# Patient Record
Sex: Female | Born: 1995 | Race: Black or African American | Hispanic: No | Marital: Single | Smoking: Never smoker
Health system: Southern US, Community
[De-identification: ages and names within clinical notes are randomized; demographics above are authoritative.]

## PROBLEM LIST (undated history)

## (undated) ENCOUNTER — Emergency Department (HOSPITAL_COMMUNITY): Admission: EM

## (undated) DIAGNOSIS — G809 Cerebral palsy, unspecified: Secondary | ICD-10-CM

## (undated) DIAGNOSIS — R6250 Unspecified lack of expected normal physiological development in childhood: Secondary | ICD-10-CM

## (undated) HISTORY — DX: Unspecified lack of expected normal physiological development in childhood: R62.50

## (undated) HISTORY — PX: LAPAROSCOPIC REVISION VENTRICULAR-PERITONEAL (V-P) SHUNT: SHX5924

---
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
## 2014-06-24 ENCOUNTER — Encounter (HOSPITAL_COMMUNITY): Payer: Self-pay | Admitting: Emergency Medicine

## 2014-06-24 ENCOUNTER — Emergency Department (HOSPITAL_COMMUNITY)
Admission: EM | Admit: 2014-06-24 | Discharge: 2014-06-24 | Disposition: A | Discharge status: Home / Self Care | Payer: Medicaid Other | Attending: Emergency Medicine | Admitting: Emergency Medicine

## 2014-06-24 DIAGNOSIS — R21 Rash and other nonspecific skin eruption: Secondary | ICD-10-CM | POA: Diagnosis present

## 2014-06-24 DIAGNOSIS — Z3202 Encounter for pregnancy test, result negative: Secondary | ICD-10-CM | POA: Diagnosis not present

## 2014-06-24 DIAGNOSIS — IMO0002 Reserved for concepts with insufficient information to code with codable children: Secondary | ICD-10-CM | POA: Insufficient documentation

## 2014-06-24 DIAGNOSIS — N390 Urinary tract infection, site not specified: Secondary | ICD-10-CM | POA: Insufficient documentation

## 2014-06-24 DIAGNOSIS — N72 Inflammatory disease of cervix uteri: Secondary | ICD-10-CM | POA: Diagnosis not present

## 2014-06-24 LAB — URINALYSIS, ROUTINE W REFLEX MICROSCOPIC
Bilirubin Urine: NEGATIVE
Glucose, UA: NEGATIVE mg/dL
Ketones, ur: NEGATIVE mg/dL
Nitrite: NEGATIVE
Protein, ur: 30 mg/dL — AB
Specific Gravity, Urine: 1.025 (ref 1.005–1.030)
Urobilinogen, UA: 1 mg/dL (ref 0.0–1.0)
pH: 7.5 (ref 5.0–8.0)

## 2014-06-24 LAB — URINE MICROSCOPIC-ADD ON

## 2014-06-24 LAB — WET PREP, GENITAL
CLUE CELLS WET PREP: NONE SEEN
Trich, Wet Prep: NONE SEEN
YEAST WET PREP: NONE SEEN

## 2014-06-24 LAB — RPR

## 2014-06-24 LAB — POC URINE PREG, ED: Preg Test, Ur: NEGATIVE

## 2014-06-24 LAB — HIV ANTIBODY (ROUTINE TESTING W REFLEX): HIV 1&2 Ab, 4th Generation: NONREACTIVE

## 2014-06-24 MED ORDER — CEPHALEXIN 500 MG PO CAPS: 500.0000 mg | ORAL_CAPSULE | Freq: Four times a day (QID) | ORAL | Status: DC

## 2014-06-24 MED ORDER — CEFTRIAXONE SODIUM 1 G IJ SOLR
1.0000 g | Freq: Once | INTRAMUSCULAR | Status: AC
  Administered 2014-06-24: 1 g via INTRAMUSCULAR
  Filled 2014-06-24: qty 10

## 2014-06-24 MED ORDER — MUPIROCIN CALCIUM 2 % EX CREA: 1.0000 "application " | TOPICAL_CREAM | Freq: Two times a day (BID) | CUTANEOUS | Status: DC

## 2014-06-24 MED ORDER — HYDROCODONE-ACETAMINOPHEN 5-325 MG PO TABS: ORAL_TABLET | ORAL | Status: DC

## 2014-06-24 MED ORDER — ACETAMINOPHEN 325 MG PO TABS
650.0000 mg | ORAL_TABLET | Freq: Once | ORAL | Status: AC
  Administered 2014-06-24: 650 mg via ORAL
  Filled 2014-06-24: qty 2

## 2014-06-24 MED ORDER — LIDOCAINE HCL (PF) 1 % IJ SOLN
2.0000 mL | Freq: Once | INTRAMUSCULAR | Status: AC
  Administered 2014-06-24: 2 mL via INTRADERMAL
  Filled 2014-06-24: qty 2

## 2014-06-24 MED ORDER — AZITHROMYCIN 250 MG PO TABS
1000.0000 mg | ORAL_TABLET | Freq: Once | ORAL | Status: AC
  Administered 2014-06-24: 1000 mg via ORAL
  Filled 2014-06-24: qty 4

## 2014-06-24 NOTE — ED Notes (Signed)
Pt states that her left leg has been hurting for several weeks and now has red rash on inner thigh, also c/o burning upon urination.

## 2014-06-24 NOTE — ED Provider Notes (Signed)
CSN: [id number]     Arrival date & time 06/24/14  4098 History   First MD Initiated Contact with Patient 06/24/14 [id number]     Chief Complaint  Patient presents with  . Rash  . Urinary Tract Infection     (Consider location/radiation/quality/duration/timing/severity/associated sxs/prior Treatment) HPI  Carol Wheeler is a 18 y.o. female complaining of rash and perennial area, dysuria and abnormal vaginal discharge (yellowish) within the last 24 hours. Patient states that she had recent unprotected sex 4 days ago. She denies fever, abdominal pain, nausea vomiting, change in bowel habits. Has history of Chlamydia in the past, date she received treatment. Patient states she's been applying diaper cream and talcum power to the rash with little relief. States that it is uncomfortable and painful. She denies itching.  Past Medical History  Diagnosis Date  . Developmental delay    Past Surgical History  Procedure Laterality Date  . Laparoscopic revision ventricular-peritoneal (v-p) shunt     No family history on file. History  Substance Use Topics  . Smoking status: Never Smoker   . Smokeless tobacco: Not on file  . Alcohol Use: No   OB History   Grav Para Term Preterm Abortions TAB SAB Ect Mult Living                 Review of Systems  10 systems reviewed and found to be negative, except as noted in the HPI.   Allergies  Review of patient's allergies indicates no known allergies.  Home Medications   Prior to Admission medications   Medication Sig Start Date End Date Taking? Authorizing Provider  Fluocinolone Acetonide (DERMA-SMOOTHE/FS SCALP) 0.01 % OIL Apply 1 application topically daily.   Yes Historical Provider, MD  cephALEXin (KEFLEX) 500 MG capsule Take 1 capsule (500 mg total) by mouth 4 (four) times daily. 06/24/14   Wright Gravely, PA-C  HYDROcodone-acetaminophen (NORCO/VICODIN) 5-325 MG per tablet Take 1-2 tablets by mouth every 6 hours as needed for pain. 06/24/14    Deklen Popelka, PA-C  mupirocin cream (BACTROBAN) 2 % Apply 1 application topically 2 (two) times daily. 06/24/14   Sahory Nordling, PA-C   BP 124/63  Pulse 83  Temp(Src) 98.5 F (36.9 C) (Oral)  Resp 15  Ht  (1.6 m)  Wt 144 lb (65.318 kg)  BMI 25.51 kg/m2  SpO2 99%  LMP 03/24/2014 Physical Exam  Nursing note and vitals reviewed. Constitutional: She is oriented to person, place, and time. She appears well-developed and well-nourished. No distress.  HENT:  Head: Normocephalic and atraumatic.  Mouth/Throat: Oropharynx is clear and moist.  Eyes: Conjunctivae and EOM are normal. Pupils are equal, round, and reactive to light.  Cardiovascular: Normal rate, regular rhythm and intact distal pulses.   Pulmonary/Chest: Effort normal and breath sounds normal. No stridor. No respiratory distress. She has no wheezes. She has no rales. She exhibits no tenderness.  Abdominal: Soft. Bowel sounds are normal. She exhibits no distension and no mass. There is no tenderness. There is no rebound and no guarding.  Genitourinary:     Pelvic exam is chaperoned by technician:  No discrete blistering lesions  Large patches of induration, erythematous and tender to palpation as diagrammed. No focal fluctuance.  Foul-smelling yellow opaque vaginal discharge. No cervical motion or adnexal tenderness.  Musculoskeletal: Normal range of motion. She exhibits no edema.  Neurological: She is alert and oriented to person, place, and time.  Delayed cognition  Skin: Skin is warm.  Psychiatric: She has a  normal mood and affect.    ED Course  Procedures (including critical care time) Labs Review Labs Reviewed  WET PREP, GENITAL - Abnormal; Notable for the following:    WBC, Wet Prep HPF POC TOO NUMEROUS TO COUNT (*)    All other components within normal limits  URINALYSIS, ROUTINE W REFLEX MICROSCOPIC - Abnormal; Notable for the following:    APPearance TURBID (*)    Hgb urine dipstick SMALL (*)      Protein, ur 30 (*)    Leukocytes, UA LARGE (*)    All other components within normal limits  URINE MICROSCOPIC-ADD ON - Abnormal; Notable for the following:    Squamous Epithelial / LPF FEW (*)    Bacteria, UA MANY (*)    Crystals TRIPLE PHOSPHATE CRYSTALS (*)    All other components within normal limits  GC/CHLAMYDIA PROBE AMP  RPR  HIV ANTIBODY (ROUTINE TESTING)  POC URINE PREG, ED    Imaging Review No results found.   EKG Interpretation None      MDM   Final diagnoses:  Cervicitis  Rash    Filed Vitals:   06/24/14 0845 06/24/14 1002  BP: 126/77 124/63  Pulse: 92 83  Temp: 98.5 F (36.9 C)   TempSrc: Oral   Resp: 18 15  Height:  (1.6 m)   Weight: 144 lb (65.318 kg)   SpO2: 100% 99%    Medications  cefTRIAXone (ROCEPHIN) injection 1 g (not administered)  azithromycin (ZITHROMAX) tablet 1,000 mg (not administered)  acetaminophen (TYLENOL) tablet 650 mg (not administered)  lidocaine (PF) (XYLOCAINE) 1 % injection 2 mL (not administered)    Carol Wheeler is a 18 y.o. female presenting with foul-smelling vaginal discharge, perennial rash or recent unprotected sex. Serial abdominal exams are benign. Patient has no systemic signs of infection. Pelvic exam is not concerning for pelvic inflammatory disease: No cervical motion or adnexal tenderness.. Patient's rash is atypical and appears to be a cellulitis rather than a herpes or other rash with discrete lesions. Wet prep shows too numerous to count white blood cells. She will be given Rocephin and azithromycin. Patient's UA shows many bacteria and too numerous to count white blood cells with large leukocytes but she is nitrite negative. It is unclear if this is a urinary tract infection or contamination from the cervicitis. I think that her pain at urination may be from irritation to the cellulitis on her thighs. Patient will also be started on Keflex for cellulitis and UTI. I have encouraged her to refrain from  sex for at least several weeks. HIV and RPR are pending. We have discussed return precautions.   Evaluation does not show pathology that would require ongoing emergent intervention or inpatient treatment. Pt is hemodynamically stable and mentating appropriately. Discussed findings and plan with patient/guardian, who agrees with care plan. All questions answered. Return precautions discussed and outpatient follow up given.   New Prescriptions   CEPHALEXIN (KEFLEX) 500 MG CAPSULE    Take 1 capsule (500 mg total) by mouth 4 (four) times daily.   HYDROCODONE-ACETAMINOPHEN (NORCO/VICODIN) 5-325 MG PER TABLET    Take 1-2 tablets by mouth every 6 hours as needed for pain.   MUPIROCIN CREAM (BACTROBAN) 2 %    Apply 1 application topically 2 (two) times daily.         Wynetta Emery, PA-C 06/24/14 1025

## 2014-06-24 NOTE — Discharge Instructions (Signed)
You were not tested for all STDs today. Your gonorrhea and chlamydia tests are pending- if they are positive, you will receive a phone call. Refrain from sex until you have the results from a full STD screen.   Take vicodin for breakthrough pain, do not drink alcohol, drive, care for children or do other critical tasks while taking vicodin.  Take your antibiotics as directed and to completion. You should never have any leftover antibiotics! Push fluids and stay well hydrated.   Please follow with your primary care doctor in the next 2 days for a check-up. They must obtain records for further management.   Do not hesitate to return to the Emergency Department for any new, worsening or concerning symptoms.    Cervicitis Cervicitis is a soreness and swelling (inflammation) of the cervix. Your cervix is located at the bottom of your uterus. It opens up to the vagina. CAUSES   Sexually transmitted infections (STIs).   Allergic reaction.   Medicines or birth control devices that are put in the vagina.   Injury to the cervix.   Bacterial infections.  RISK FACTORS You are at greater risk if you:  Have unprotected sexual intercourse.  Have sexual intercourse with many partners.  Began sexual intercourse at an early age.  Have a history of STIs. SYMPTOMS  There may be no symptoms. If symptoms occur, they may include:   Gray, white, yellow, or bad-smelling vaginal discharge.   Pain or itching of the area outside the vagina.   Painful sexual intercourse.   Lower abdominal or lower back pain, especially during intercourse.   Frequent urination.   Abnormal vaginal bleeding between periods, after sexual intercourse, or after menopause.   Pressure or a heavy feeling in the pelvis.  DIAGNOSIS  Diagnosis is made after a pelvic exam. Other tests may include:   Examination of any discharge under a microscope (wet prep).   A Pap test.  TREATMENT  Treatment will  depend on the cause of cervicitis. If it is caused by an STI, both you and your partner will need to be treated. Antibiotic medicines will be given.  HOME CARE INSTRUCTIONS   Do not have sexual intercourse until your health care provider says it is okay.   Do not have sexual intercourse until your partner has been treated, if your cervicitis is caused by an STI.   Take your antibiotics as directed. Finish them even if you start to feel better.  SEEK MEDICAL CARE IF:  Your symptoms come back.   You have a fever.  MAKE SURE YOU:   Understand these instructions.  Will watch your condition.  Will get help right away if you are not doing well or get worse. Document Released: 10/01/2005 Document Revised: 10/06/2013 Document Reviewed: 03/25/2013 [facility] Patient Information 2015 [place], [place]. This information is not intended to replace advice given to you by your health care provider. Make sure you discuss any questions you have with your health care provider.

## 2014-06-25 LAB — GC/CHLAMYDIA PROBE AMP
CT Probe RNA: NEGATIVE
GC Probe RNA: NEGATIVE

## 2014-06-28 NOTE — ED Provider Notes (Signed)
Medical screening examination/treatment/procedure(s) were performed by non-physician practitioner and as supervising physician I was immediately available for consultation/collaboration.   EKG Interpretation None        Rayon Mcchristian David Leisa Gault III, MD 06/28/14 1633 

## 2014-10-17 ENCOUNTER — Encounter (HOSPITAL_COMMUNITY): Payer: Self-pay | Admitting: *Deleted

## 2014-10-17 ENCOUNTER — Other Ambulatory Visit (HOSPITAL_COMMUNITY)
Admission: RE | Admit: 2014-10-17 | Discharge: 2014-10-17 | Disposition: A | Discharge status: Home / Self Care | Payer: Medicaid Other | Source: Ambulatory Visit | Attending: Emergency Medicine | Admitting: Emergency Medicine

## 2014-10-17 ENCOUNTER — Emergency Department (INDEPENDENT_AMBULATORY_CARE_PROVIDER_SITE_OTHER)
Admission: EM | Admit: 2014-10-17 | Discharge: 2014-10-17 | Disposition: A | Discharge status: Home / Self Care | Payer: 59 | Source: Home / Self Care

## 2014-10-17 DIAGNOSIS — N760 Acute vaginitis: Secondary | ICD-10-CM | POA: Diagnosis present

## 2014-10-17 DIAGNOSIS — N730 Acute parametritis and pelvic cellulitis: Secondary | ICD-10-CM

## 2014-10-17 DIAGNOSIS — Z113 Encounter for screening for infections with a predominantly sexual mode of transmission: Secondary | ICD-10-CM | POA: Insufficient documentation

## 2014-10-17 LAB — CBC WITH DIFFERENTIAL/PLATELET
Basophils Absolute: 0 10*3/uL (ref 0.0–0.1)
Basophils Relative: 0 % (ref 0–1)
EOS ABS: 0 10*3/uL (ref 0.0–0.7)
EOS PCT: 0 % (ref 0–5)
HEMATOCRIT: 43.3 % (ref 36.0–46.0)
HEMOGLOBIN: 14.3 g/dL (ref 12.0–15.0)
Lymphocytes Relative: 34 % (ref 12–46)
Lymphs Abs: 2.4 10*3/uL (ref 0.7–4.0)
MCH: 27.2 pg (ref 26.0–34.0)
MCHC: 33 g/dL (ref 30.0–36.0)
MCV: 82.5 fL (ref 78.0–100.0)
MONO ABS: 0.6 10*3/uL (ref 0.1–1.0)
MONOS PCT: 8 % (ref 3–12)
Neutro Abs: 3.9 10*3/uL (ref 1.7–7.7)
Neutrophils Relative %: 58 % (ref 43–77)
Platelets: 242 10*3/uL (ref 150–400)
RBC: 5.25 MIL/uL — AB (ref 3.87–5.11)
RDW: 13.5 % (ref 11.5–15.5)
WBC: 6.9 10*3/uL (ref 4.0–10.5)

## 2014-10-17 LAB — POCT I-STAT, CHEM 8
BUN: 5 mg/dL — ABNORMAL LOW (ref 6–23)
CREATININE: 0.8 mg/dL (ref 0.50–1.10)
Calcium, Ion: 1.19 mmol/L (ref 1.12–1.23)
Chloride: 103 mEq/L (ref 96–112)
GLUCOSE: 84 mg/dL (ref 70–99)
HCT: 50 % — ABNORMAL HIGH (ref 36.0–46.0)
HEMOGLOBIN: 17 g/dL — AB (ref 12.0–15.0)
Potassium: 3.9 mmol/L (ref 3.5–5.1)
Sodium: 140 mmol/L (ref 135–145)
TCO2: 22 mmol/L (ref 0–100)

## 2014-10-17 LAB — HIV ANTIBODY (ROUTINE TESTING W REFLEX): HIV: NONREACTIVE

## 2014-10-17 LAB — POCT URINALYSIS DIP (DEVICE)
BILIRUBIN URINE: NEGATIVE
Glucose, UA: NEGATIVE mg/dL
HGB URINE DIPSTICK: NEGATIVE
KETONES UR: NEGATIVE mg/dL
NITRITE: NEGATIVE
PH: 7.5 (ref 5.0–8.0)
Protein, ur: NEGATIVE mg/dL
SPECIFIC GRAVITY, URINE: 1.015 (ref 1.005–1.030)
Urobilinogen, UA: 1 mg/dL (ref 0.0–1.0)

## 2014-10-17 LAB — RPR

## 2014-10-17 LAB — POCT PREGNANCY, URINE: Preg Test, Ur: NEGATIVE

## 2014-10-17 MED ORDER — ONDANSETRON 4 MG PO TBDP
8.0000 mg | ORAL_TABLET | Freq: Once | ORAL | Status: AC
  Administered 2014-10-17: 8 mg via ORAL

## 2014-10-17 MED ORDER — CEFTRIAXONE SODIUM 250 MG IJ SOLR
250.0000 mg | Freq: Once | INTRAMUSCULAR | Status: AC
  Administered 2014-10-17: 250 mg via INTRAMUSCULAR

## 2014-10-17 MED ORDER — CEFTRIAXONE SODIUM 250 MG IJ SOLR
INTRAMUSCULAR | Status: AC
  Filled 2014-10-17: qty 250

## 2014-10-17 MED ORDER — HYDROCODONE-ACETAMINOPHEN 5-325 MG PO TABS: ORAL_TABLET | ORAL | Status: DC

## 2014-10-17 MED ORDER — DOXYCYCLINE HYCLATE 100 MG PO TABS: 100.0000 mg | ORAL_TABLET | Freq: Two times a day (BID) | ORAL | Status: DC

## 2014-10-17 MED ORDER — ONDANSETRON 8 MG PO TBDP: 8.0000 mg | ORAL_TABLET | Freq: Three times a day (TID) | ORAL | Status: DC | PRN

## 2014-10-17 MED ORDER — AZITHROMYCIN 250 MG PO TABS
1000.0000 mg | ORAL_TABLET | Freq: Once | ORAL | Status: AC
  Administered 2014-10-17: 1000 mg via ORAL

## 2014-10-17 MED ORDER — LIDOCAINE HCL (PF) 1 % IJ SOLN
INTRAMUSCULAR | Status: AC
  Filled 2014-10-17: qty 5

## 2014-10-17 MED ORDER — AZITHROMYCIN 250 MG PO TABS
ORAL_TABLET | ORAL | Status: AC
  Filled 2014-10-17: qty 4

## 2014-10-17 MED ORDER — ONDANSETRON 4 MG PO TBDP
ORAL_TABLET | ORAL | Status: AC
  Filled 2014-10-17: qty 2

## 2014-10-17 MED ORDER — METRONIDAZOLE 500 MG PO TABS: 500.0000 mg | ORAL_TABLET | Freq: Two times a day (BID) | ORAL | Status: DC

## 2014-10-17 NOTE — ED Notes (Signed)
Pt  Reports  Nausea   With  intermittant  abd  Pain       whitch  Developed  Last      Pm      She  denys   Any   Vomiting     Pt  Ambulated  To  Room with a  Steady  Fluid     Gait    Appearing  In no  Distress

## 2014-10-17 NOTE — ED Provider Notes (Signed)
Chief Complaint   Nausea   History of Present Illness   Carol Wheeler is a 19 year old female who's had a two-day history of abdominal pain. This began in the epigastric area and radiated to the bilateral lower abdomen. It's been as high as a 10 over 10 in intensity now is an 8/10. It's been associated with nausea, lower back pain, chills, and a few loose stools. She denies any fever, vomiting, dysuria, frequency, blood in the urine, vaginal discharge, itching, abnormal bleeding. Her last menstrual period was 2 weeks ago. She is sexually active with use of the NuvaRing but no other protection. The patient has an abortion about a year ago.  Review of Systems   Other than as noted above, the patient denies any of the following symptoms: Constitutional:  No fever, chills, weight loss or anorexia. Abdomen:  No nausea, vomiting, hematememesis, melena, diarrhea, or hematochezia. GU:  No dysuria, frequency, urgency, or hematuria. Gyn:  No vaginal discharge, itching, abnormal bleeding, dyspareunia, or pelvic pain.  PMFSH   Past medical history, family history, social history, meds, and allergies were reviewed. She has a ventriculoperitoneal shunt.  Physical Exam     Vital signs:  BP 100/72 mmHg  Pulse 99  Temp(Src) 98.2 F (36.8 C) (Oral)  Resp 16  SpO2 98%  LMP 10/04/2014 Gen:  Alert, oriented, in no distress. Lungs:  Breath sounds clear and equal bilaterally.  No wheezes, rales or rhonchi. Heart:  Regular rhythm.  No gallops or murmers.   Abdomen:  Soft, flat, nondistended. No organomegaly or mass. Bowel sounds are normally active. The patient has mild, diffuse tenderness to palpation without guarding or rebound. No localizing tenderness to palpation. Pelvic:  Normal external genitalia, vaginal and cervical mucosa were normal. There was some whitish discharge coming from the cervical os. There was tenderness with cervical motion, uterus was normal in size and shape and was  moderately tender. She has moderate bilateral adnexal tenderness without a mass.  DNA probes for gonorrhea, Chlamydia, Trichomonas, Gardnerella, and Candida were obtained. Skin:  Clear, warm and dry.  No rash.  Labs   Results for orders placed or performed during the hospital encounter of 10/17/14  CBC with Differential  Result Value Ref Range   WBC 6.9 4.0 - 10.5 K/uL   RBC 5.25 (H) 3.87 - 5.11 MIL/uL   Hemoglobin 14.3 12.0 - 15.0 g/dL   HCT 54.0 98.1 - 19.1 %   MCV 82.5 78.0 - 100.0 fL   MCH 27.2 26.0 - 34.0 pg   MCHC 33.0 30.0 - 36.0 g/dL   RDW 47.8 29.5 - 62.1 %   Platelets 242 150 - 400 K/uL   Neutrophils Relative % 58 43 - 77 %   Neutro Abs 3.9 1.7 - 7.7 K/uL   Lymphocytes Relative 34 12 - 46 %   Lymphs Abs 2.4 0.7 - 4.0 K/uL   Monocytes Relative 8 3 - 12 %   Monocytes Absolute 0.6 0.1 - 1.0 K/uL   Eosinophils Relative 0 0 - 5 %   Eosinophils Absolute 0.0 0.0 - 0.7 K/uL   Basophils Relative 0 0 - 1 %   Basophils Absolute 0.0 0.0 - 0.1 K/uL  POCT urinalysis dip (device)  Result Value Ref Range   Glucose, UA NEGATIVE NEGATIVE mg/dL   Bilirubin Urine NEGATIVE NEGATIVE   Ketones, ur NEGATIVE NEGATIVE mg/dL   Specific Gravity, Urine 1.015 1.005 - 1.030   Hgb urine dipstick NEGATIVE NEGATIVE   pH 7.5 5.0 -  8.0   Protein, ur NEGATIVE NEGATIVE mg/dL   Urobilinogen, UA 1.0 0.0 - 1.0 mg/dL   Nitrite NEGATIVE NEGATIVE   Leukocytes, UA SMALL (A) NEGATIVE  Pregnancy, urine POC  Result Value Ref Range   Preg Test, Ur NEGATIVE NEGATIVE  I-STAT, chem 8  Result Value Ref Range   Sodium 140 135 - 145 mmol/L   Potassium 3.9 3.5 - 5.1 mmol/L   Chloride 103 96 - 112 mEq/L   BUN 5 (L) 6 - 23 mg/dL   Creatinine, Ser 1.61 0.50 - 1.10 mg/dL   Glucose, Bld 84 70 - 99 mg/dL   Calcium, Ion 0.96 0.45 - 1.23 mmol/L   TCO2 22 0 - 100 mmol/L   Hemoglobin 17.0 (H) 12.0 - 15.0 g/dL   HCT 40.9 (H) 81.1 - 91.4 %   Course in Urgent Care Center   The following medications were  given:  Medications  ondansetron (ZOFRAN-ODT) disintegrating tablet 8 mg (8 mg Oral Given 10/17/14 1706)  cefTRIAXone (ROCEPHIN) injection 250 mg (250 mg Intramuscular Given 10/17/14 1811)  azithromycin (ZITHROMAX) tablet 1,000 mg (1,000 mg Oral Given 10/17/14 1811)   Assessment   The encounter diagnosis was PID (acute pelvic inflammatory disease).  No evidence of appendicitis and she does not have any elevation of her white count. Do not think she has a tubo-ovarian abscess, since there is no mass on her pelvic exam.  Plan     1.  Meds:  The following meds were prescribed:   Discharge Medication List as of 10/17/2014  6:00 PM    START taking these medications   Details  doxycycline (VIBRA-TABS) 100 MG tablet Take 1 tablet (100 mg total) by mouth 2 (two) times daily., Starting 10/17/2014, Until Discontinued, Normal    !! HYDROcodone-acetaminophen (NORCO/VICODIN) 5-325 MG per tablet 1 to 2 tabs every 4 to 6 hours as needed for pain., Print    metroNIDAZOLE (FLAGYL) 500 MG tablet Take 1 tablet (500 mg total) by mouth 2 (two) times daily., Starting 10/17/2014, Until Discontinued, Normal    ondansetron (ZOFRAN ODT) 8 MG disintegrating tablet Take 1 tablet (8 mg total) by mouth every 8 (eight) hours as needed for nausea., Starting 10/17/2014, Until Discontinued, Normal     !! - Potential duplicate medications found. Please discuss with provider.      2.  Patient Education/Counseling:  The patient was given appropriate handouts, self care instructions, and instructed in symptomatic relief.    3.  Follow up:  The patient was told to follow up here for a scheduled recheck in 2 days, or sooner if becoming worse in any way, and given some red flag symptoms such as worsening pain, fever, vomiting, or evidence of GI bleeding which would prompt immediate return.     Reuben Likes, MD 10/17/14 [phone]

## 2014-10-17 NOTE — Discharge Instructions (Signed)
Pelvic Inflammatory Disease °Pelvic inflammatory disease (PID) refers to an infection in some or all of the female organs. The infection can be in the uterus, ovaries, fallopian tubes, or the surrounding tissues in the pelvis. PID can cause abdominal or pelvic pain that comes on suddenly (acute pelvic pain). PID is a serious infection because it can lead to lasting (chronic) pelvic pain or the inability to have children (infertile).  °CAUSES  °The infection is often caused by the normal bacteria found in the vaginal tissues. PID may also be caused by an infection that is spread during sexual contact. PID can also occur following:  °· The birth of a baby.   °· A miscarriage.   °· An abortion.   °· Major pelvic surgery.   °· The use of an intrauterine device (IUD).   °· A sexual assault.   °RISK FACTORS °Certain factors can put a person at higher risk for PID, such as: °· Being younger than 25 years. °· Being sexually active at a young age. °· Using nonbarrier contraception. °· Having multiple sexual partners. °· Having sex with someone who has symptoms of a genital infection. °· Using oral contraception. °Other times, certain behaviors can increase the possibility of getting PID, such as: °· Having sex during your period. °· Using a vaginal douche. °· Having an intrauterine device (IUD) in place. °SYMPTOMS  °· Abdominal or pelvic pain.   °· Fever.   °· Chills.   °· Abnormal vaginal discharge. °· Abnormal uterine bleeding.   °· Unusual pain shortly after finishing your period. °DIAGNOSIS  °Your caregiver will choose some of the following methods to make a diagnosis, such as:  °· Performing a physical exam and history. A pelvic exam typically reveals a very tender uterus and surrounding pelvis.   °· Ordering laboratory tests including a pregnancy test, blood tests, and urine test.  °· Ordering cultures of the vagina and cervix to check for a sexually transmitted infection (STI). °· Performing an ultrasound.    °· Performing a laparoscopic procedure to look inside the pelvis.   °TREATMENT  °· Antibiotic medicines may be prescribed and taken by mouth.   °· Sexual partners may be treated when the infection is caused by a sexually transmitted disease (STD).   °· Hospitalization may be needed to give antibiotics intravenously. °· Surgery may be needed, but this is rare. °It may take weeks until you are completely well. If you are diagnosed with PID, you should also be checked for human immunodeficiency virus (HIV).   °HOME CARE INSTRUCTIONS  °· If given, take your antibiotics as directed. Finish the medicine even if you start to feel better.   °· Only take over-the-counter or prescription medicines for pain, discomfort, or fever as directed by your caregiver.   °· Do not have sexual intercourse until treatment is completed or as directed by your caregiver. If PID is confirmed, your recent sexual partner(s) will need treatment.   °· Keep your follow-up appointments. °SEEK MEDICAL CARE IF:  °· You have increased or abnormal vaginal discharge.   °· You need prescription medicine for your pain.   °· You vomit.   °· You cannot take your medicines.   °· Your partner has an STD.   °SEEK IMMEDIATE MEDICAL CARE IF:  °· You have a fever.   °· You have increased abdominal or pelvic pain.   °· You have chills.   °· You have pain when you urinate.   °· You are not better after 72 hours following treatment.   °MAKE SURE YOU:  °· Understand these instructions. °· Will watch your condition. °· Will get help right away if you are not doing well or get worse. °  Document Released: 10/01/2005 Document Revised: 01/26/2013 Document Reviewed: 09/27/2011 °[employer]® Patient Information ©2015 [employer]. This information is not intended to replace advice given to you by your health care provider. Make sure you discuss any questions you have with your health care provider. ° °

## 2014-10-18 LAB — CERVICOVAGINAL ANCILLARY ONLY
Chlamydia: NEGATIVE
Neisseria Gonorrhea: NEGATIVE
WET PREP (BD AFFIRM): NEGATIVE
Wet Prep (BD Affirm): NEGATIVE
Wet Prep (BD Affirm): NEGATIVE

## 2014-10-18 LAB — URINE CULTURE
Colony Count: >=100000
SPECIAL REQUESTS: NORMAL

## 2014-10-26 DIAGNOSIS — Z792 Long term (current) use of antibiotics: Secondary | ICD-10-CM | POA: Diagnosis not present

## 2014-10-26 DIAGNOSIS — Z3202 Encounter for pregnancy test, result negative: Secondary | ICD-10-CM | POA: Diagnosis not present

## 2014-10-26 DIAGNOSIS — Z7952 Long term (current) use of systemic steroids: Secondary | ICD-10-CM | POA: Diagnosis not present

## 2014-10-26 DIAGNOSIS — R112 Nausea with vomiting, unspecified: Secondary | ICD-10-CM | POA: Diagnosis not present

## 2014-10-26 DIAGNOSIS — R1013 Epigastric pain: Secondary | ICD-10-CM | POA: Insufficient documentation

## 2014-10-26 DIAGNOSIS — R197 Diarrhea, unspecified: Secondary | ICD-10-CM | POA: Diagnosis not present

## 2014-10-27 ENCOUNTER — Emergency Department (HOSPITAL_COMMUNITY)
Admission: EM | Admit: 2014-10-27 | Discharge: 2014-10-27 | Disposition: A | Discharge status: Home / Self Care | Payer: 59 | Attending: Emergency Medicine | Admitting: Emergency Medicine

## 2014-10-27 ENCOUNTER — Encounter (HOSPITAL_COMMUNITY): Payer: Self-pay | Admitting: Emergency Medicine

## 2014-10-27 DIAGNOSIS — R1013 Epigastric pain: Secondary | ICD-10-CM | POA: Diagnosis not present

## 2014-10-27 DIAGNOSIS — R1110 Vomiting, unspecified: Secondary | ICD-10-CM

## 2014-10-27 DIAGNOSIS — R197 Diarrhea, unspecified: Secondary | ICD-10-CM

## 2014-10-27 LAB — PREGNANCY, URINE: PREG TEST UR: NEGATIVE

## 2014-10-27 LAB — COMPREHENSIVE METABOLIC PANEL
ALK PHOS: 57 U/L (ref 39–117)
ALT: 18 U/L (ref 0–35)
AST: 21 U/L (ref 0–37)
Albumin: 3.9 g/dL (ref 3.5–5.2)
Anion gap: 9 (ref 5–15)
BUN: 9 mg/dL (ref 6–23)
CO2: 26 mmol/L (ref 19–32)
Calcium: 9 mg/dL (ref 8.4–10.5)
Chloride: 105 mEq/L (ref 96–112)
Creatinine, Ser: 0.76 mg/dL (ref 0.50–1.10)
GFR calc Af Amer: >90 mL/min (ref >90)
GLUCOSE: 90 mg/dL (ref 70–99)
POTASSIUM: 3.4 mmol/L — AB (ref 3.5–5.1)
Sodium: 140 mmol/L (ref 135–145)
Total Bilirubin: 0.7 mg/dL (ref 0.3–1.2)
Total Protein: 7.1 g/dL (ref 6.0–8.3)

## 2014-10-27 LAB — URINE MICROSCOPIC-ADD ON

## 2014-10-27 LAB — CBC WITH DIFFERENTIAL/PLATELET
BASOS PCT: 0 % (ref 0–1)
Basophils Absolute: 0 10*3/uL (ref 0.0–0.1)
Eosinophils Absolute: 0.1 10*3/uL (ref 0.0–0.7)
Eosinophils Relative: 1 % (ref 0–5)
HCT: 42.8 % (ref 36.0–46.0)
Hemoglobin: 14.1 g/dL (ref 12.0–15.0)
Lymphocytes Relative: 32 % (ref 12–46)
Lymphs Abs: 2.7 10*3/uL (ref 0.7–4.0)
MCH: 27.6 pg (ref 26.0–34.0)
MCHC: 32.9 g/dL (ref 30.0–36.0)
MCV: 83.8 fL (ref 78.0–100.0)
MONOS PCT: 7 % (ref 3–12)
Monocytes Absolute: 0.6 10*3/uL (ref 0.1–1.0)
NEUTROS ABS: 5.1 10*3/uL (ref 1.7–7.7)
NEUTROS PCT: 60 % (ref 43–77)
PLATELETS: 222 10*3/uL (ref 150–400)
RBC: 5.11 MIL/uL (ref 3.87–5.11)
RDW: 13.4 % (ref 11.5–15.5)
WBC: 8.5 10*3/uL (ref 4.0–10.5)

## 2014-10-27 LAB — LIPASE, BLOOD: LIPASE: 32 U/L (ref 11–59)

## 2014-10-27 LAB — URINALYSIS, ROUTINE W REFLEX MICROSCOPIC
Bilirubin Urine: NEGATIVE
GLUCOSE, UA: NEGATIVE mg/dL
HGB URINE DIPSTICK: NEGATIVE
KETONES UR: NEGATIVE mg/dL
Nitrite: NEGATIVE
Protein, ur: NEGATIVE mg/dL
Specific Gravity, Urine: 1.03 (ref 1.005–1.030)
Urobilinogen, UA: 0.2 mg/dL (ref 0.0–1.0)
pH: 5 (ref 5.0–8.0)

## 2014-10-27 MED ORDER — GI COCKTAIL ~~LOC~~
ORAL | Status: AC
  Filled 2014-10-27: qty 30

## 2014-10-27 MED ORDER — SUCRALFATE 1 GM/10ML PO SUSP: 1.0000 g | Freq: Three times a day (TID) | ORAL | Status: DC | PRN

## 2014-10-27 MED ORDER — ONDANSETRON HCL 4 MG/2ML IJ SOLN
INTRAMUSCULAR | Status: AC
  Filled 2014-10-27: qty 2

## 2014-10-27 MED ORDER — PANTOPRAZOLE SODIUM 20 MG PO TBEC: 20.0000 mg | DELAYED_RELEASE_TABLET | Freq: Every day | ORAL | Status: DC

## 2014-10-27 MED ORDER — ONDANSETRON 4 MG PO TBDP: 4.0000 mg | ORAL_TABLET | Freq: Three times a day (TID) | ORAL | Status: DC | PRN

## 2014-10-27 MED ORDER — PANTOPRAZOLE SODIUM 40 MG IV SOLR
INTRAVENOUS | Status: AC
  Filled 2014-10-27: qty 40

## 2014-10-27 NOTE — ED Notes (Signed)
Pt. reports intermittent mid abdominal pain with emesis and diarrhea onset last week , denies fever or chills.

## 2014-10-27 NOTE — Discharge Instructions (Signed)
Abdominal Pain °Many things can cause abdominal pain. Usually, abdominal pain is not caused by a disease and will improve without treatment. It can often be observed and treated at home. Your health care provider will do a physical exam and possibly order blood tests and X-rays to help determine the seriousness of your pain. However, in many cases, more time must pass before a clear cause of the pain can be found. Before that point, your health care provider may not know if you need more testing or further treatment. °HOME CARE INSTRUCTIONS  °Monitor your abdominal pain for any changes. The following actions may help to alleviate any discomfort you are experiencing: °· Only take over-the-counter or prescription medicines as directed by your health care provider. °· Do not take laxatives unless directed to do so by your health care provider. °· Try a clear liquid diet (broth, tea, or water) as directed by your health care provider. Slowly move to a bland diet as tolerated. °SEEK MEDICAL CARE IF: °· You have unexplained abdominal pain. °· You have abdominal pain associated with nausea or diarrhea. °· You have pain when you urinate or have a bowel movement. °· You experience abdominal pain that wakes you in the night. °· You have abdominal pain that is worsened or improved by eating food. °· You have abdominal pain that is worsened with eating fatty foods. °· You have a fever. °SEEK IMMEDIATE MEDICAL CARE IF:  °· Your pain does not go away within 2 hours. °· You keep throwing up (vomiting). °· Your pain is felt only in portions of the abdomen, such as the right side or the left lower portion of the abdomen. °· You pass bloody or black tarry stools. °MAKE SURE YOU: °· Understand these instructions.   °· Will watch your condition.   °· Will get help right away if you are not doing well or get worse.   °Document Released: 07/11/2005 Document Revised: 10/06/2013 Document Reviewed: 06/10/2013 °[employer]® Patient Information  ©2015 [employer]. This information is not intended to replace advice given to you by your health care provider. Make sure you discuss any questions you have with your health care provider. ° °Gastritis, Adult °Gastritis is soreness and swelling (inflammation) of the lining of the stomach. Gastritis can develop as a sudden onset (acute) or long-term (chronic) condition. If gastritis is not treated, it can lead to stomach bleeding and ulcers. °CAUSES  °Gastritis occurs when the stomach lining is weak or damaged. Digestive juices from the stomach then inflame the weakened stomach lining. The stomach lining may be weak or damaged due to viral or bacterial infections. One common bacterial infection is the Helicobacter pylori infection. Gastritis can also result from excessive alcohol consumption, taking certain medicines, or having too much acid in the stomach.  °SYMPTOMS  °In some cases, there are no symptoms. When symptoms are present, they may include: °· Pain or a burning sensation in the upper abdomen. °· Nausea. °· Vomiting. °· An uncomfortable feeling of fullness after eating. °DIAGNOSIS  °Your caregiver may suspect you have gastritis based on your symptoms and a physical exam. To determine the cause of your gastritis, your caregiver may perform the following: °· Blood or stool tests to check for the H pylori bacterium. °· Gastroscopy. A thin, flexible tube (endoscope) is passed down the esophagus and into the stomach. The endoscope has a light and camera on the end. Your caregiver uses the endoscope to view the inside of the stomach. °· Taking a tissue sample (biopsy)   from the stomach to examine under a microscope. °TREATMENT  °Depending on the cause of your gastritis, medicines may be prescribed. If you have a bacterial infection, such as an H pylori infection, antibiotics may be given. If your gastritis is caused by too much acid in the stomach, H2 blockers or antacids may be given. Your caregiver may  recommend that you stop taking aspirin, ibuprofen, or other nonsteroidal anti-inflammatory drugs (NSAIDs). °HOME CARE INSTRUCTIONS °· Only take over-the-counter or prescription medicines as directed by your caregiver. °· If you were given antibiotic medicines, take them as directed. Finish them even if you start to feel better. °· Drink enough fluids to keep your urine clear or pale yellow. °· Avoid foods and drinks that make your symptoms worse, such as: °¨ Caffeine or alcoholic drinks. °¨ Chocolate. °¨ Peppermint or mint flavorings. °¨ Garlic and onions. °¨ Spicy foods. °¨ Citrus fruits, such as oranges, lemons, or limes. °¨ Tomato-based foods such as sauce, chili, salsa, and pizza. °¨ Fried and fatty foods. °· Eat small, frequent meals instead of large meals. °SEEK IMMEDIATE MEDICAL CARE IF:  °· You have black or dark red stools. °· You vomit blood or material that looks like coffee grounds. °· You are unable to keep fluids down. °· Your abdominal pain gets worse. °· You have a fever. °· You do not feel better after 1 week. °· You have any other questions or concerns. °MAKE SURE YOU: °· Understand these instructions. °· Will watch your condition. °· Will get help right away if you are not doing well or get worse. °Document Released: 09/25/2001 Document Revised: 04/01/2012 Document Reviewed: 11/14/2011 °[employer]® Patient Information ©2015 [employer]. This information is not intended to replace advice given to you by your health care provider. Make sure you discuss any questions you have with your health care provider. ° °

## 2014-10-27 NOTE — ED Notes (Signed)
Pt. Refused wheelchair and left with all belongings 

## 2014-10-28 NOTE — ED Provider Notes (Signed)
CSN: [id number]     Arrival date & time 10/26/14  2326 History   First MD Initiated Contact with Patient 10/27/14 0524     Chief Complaint  Patient presents with  . Abdominal Pain  . Emesis  . Diarrhea     (Consider location/radiation/quality/duration/timing/severity/associated sxs/prior Treatment) HPI With no PMhx of similar abdominal pain, she presents to the ED complaining of gradual onset waxing and waning epigastric abdominal pain that started approximately 5 days ago. She is also complaining of associated nausea, emesis, and diarrhea. She states that she had one episode of emesis 5 days ago followed by several episodes of emesis today. Pt also endorses 2 episodes of diarrhea. She denies frequently taking Naproxen/Ibuprofen. Denies taking any OTC medication to relieve her symptoms. Denies any fever, chills, headache, dizziness, or SOB. She's had no urinary or vaginal symptoms. Past Medical History  Diagnosis Date  . Developmental delay    Past Surgical History  Procedure Laterality Date  . Laparoscopic revision ventricular-peritoneal (v-p) shunt     No family history on file. History  Substance Use Topics  . Smoking status: Never Smoker   . Smokeless tobacco: Not on file  . Alcohol Use: No   OB History    No data available     Review of Systems  Constitutional: Negative for fever and chills.  Respiratory: Negative for cough and shortness of breath.   Cardiovascular: Negative for chest pain, palpitations and leg swelling.  Gastrointestinal: Positive for nausea, vomiting, abdominal pain and diarrhea.  Genitourinary: Negative for dysuria, frequency, hematuria, flank pain, vaginal bleeding, vaginal discharge and difficulty urinating.  Musculoskeletal: Negative for back pain, neck pain and neck stiffness.  Skin: Negative for rash and wound.  Neurological: Negative for dizziness, facial asymmetry, weakness, light-headedness, numbness and headaches.  All other systems reviewed  and are negative.     Allergies  Review of patient's allergies indicates no known allergies.  Home Medications   Prior to Admission medications   Medication Sig Start Date End Date Taking? Authorizing Provider  cephALEXin (KEFLEX) 500 MG capsule Take 1 capsule (500 mg total) by mouth 4 (four) times daily. 06/24/14   Nicole Pisciotta, PA-C  doxycycline (VIBRA-TABS) 100 MG tablet Take 1 tablet (100 mg total) by mouth 2 (two) times daily. 10/17/14   Reuben Likesavid C Keller, MD  Fluocinolone Acetonide (DERMA-SMOOTHE/FS SCALP) 0.01 % OIL Apply 1 application topically daily.    Historical Provider, MD  HYDROcodone-acetaminophen (NORCO/VICODIN) 5-325 MG per tablet Take 1-2 tablets by mouth every 6 hours as needed for pain. 06/24/14   Nicole Pisciotta, PA-C  HYDROcodone-acetaminophen (NORCO/VICODIN) 5-325 MG per tablet 1 to 2 tabs every 4 to 6 hours as needed for pain. 10/17/14   Reuben Likesavid C Keller, MD  metroNIDAZOLE (FLAGYL) 500 MG tablet Take 1 tablet (500 mg total) by mouth 2 (two) times daily. 10/17/14   Reuben Likesavid C Keller, MD  mupirocin cream (BACTROBAN) 2 % Apply 1 application topically 2 (two) times daily. 06/24/14   Nicole Pisciotta, PA-C  ondansetron (ZOFRAN-ODT) 4 MG disintegrating tablet Take 1 tablet (4 mg total) by mouth every 8 (eight) hours as needed for nausea. 10/27/14   Loren Raceravid Lutie Pickler, MD  pantoprazole (PROTONIX) 20 MG tablet Take 1 tablet (20 mg total) by mouth daily. 10/27/14   Loren Raceravid Teandra Harlan, MD  sucralfate (CARAFATE) 1 GM/10ML suspension Take 10 mLs (1 g total) by mouth 3 (three) times daily as needed. 10/27/14   Loren Raceravid Mikal Blasdell, MD   BP 113/70 mmHg  Pulse 93  Temp(Src)  97.6 F (36.4 C) (Oral)  Resp 16  Ht  (1.626 m)  Wt 144 lb (65.318 kg)  BMI 24.71 kg/m2  SpO2 100%  LMP 10/19/2014 Physical Exam  Constitutional: She is oriented to person, place, and time. She appears well-developed and well-nourished. No distress.  Patient care well-appearing. No vomiting currently.  HENT:  Head:  Normocephalic and atraumatic.  Mouth/Throat: Oropharynx is clear and moist.  Eyes: EOM are normal. Pupils are equal, round, and reactive to light.  Neck: Normal range of motion. Neck supple.  Cardiovascular: Normal rate and regular rhythm.   Pulmonary/Chest: Effort normal and breath sounds normal. No respiratory distress. She has no wheezes. She has no rales.  Abdominal: Soft. Bowel sounds are normal. She exhibits no distension and no mass. There is tenderness (mild epigastric tenderness with palpation.). There is no rebound and no guarding.  Musculoskeletal: Normal range of motion. She exhibits no edema or tenderness.  No CVA tenderness bilaterally.  Neurological: She is alert and oriented to person, place, and time.  Skin: Skin is warm and dry. No rash noted. No erythema.  Psychiatric: She has a normal mood and affect. Her behavior is normal.  Nursing note and vitals reviewed.   ED Course  Procedures (including critical care time) Labs Review Labs Reviewed  COMPREHENSIVE METABOLIC PANEL - Abnormal; Notable for the following:    Potassium 3.4 (*)    All other components within normal limits  URINALYSIS, ROUTINE W REFLEX MICROSCOPIC - Abnormal; Notable for the following:    APPearance CLOUDY (*)    Leukocytes, UA MODERATE (*)    All other components within normal limits  URINE MICROSCOPIC-ADD ON - Abnormal; Notable for the following:    Squamous Epithelial / LPF FEW (*)    All other components within normal limits  CBC WITH DIFFERENTIAL  LIPASE, BLOOD  PREGNANCY, URINE    Imaging Review No results found.   EKG Interpretation None      MDM   Final diagnoses:  Vomiting and diarrhea  Epigastric pain    Patient's symptoms are improved after IV fluids, antiemetics, PPI, and Carafate. Workup is essentially normal. I do not believe imaging is necessary at this point. We'll start on PPI. Patient is advised to follow-up with the gastroenterologist should her symptoms worsen.  Return precautions given.    Loren Racer, MD 10/28/14 1054

## 2015-11-14 ENCOUNTER — Emergency Department (HOSPITAL_COMMUNITY)
Admission: EM | Admit: 2015-11-14 | Discharge: 2015-11-14 | Disposition: A | Discharge status: Home / Self Care | Payer: Medicaid Other | Attending: Emergency Medicine | Admitting: Emergency Medicine

## 2015-11-14 ENCOUNTER — Encounter (HOSPITAL_COMMUNITY): Payer: Self-pay | Admitting: Emergency Medicine

## 2015-11-14 DIAGNOSIS — M24542 Contracture, left hand: Secondary | ICD-10-CM | POA: Diagnosis not present

## 2015-11-14 DIAGNOSIS — R300 Dysuria: Secondary | ICD-10-CM | POA: Diagnosis present

## 2015-11-14 DIAGNOSIS — Z8669 Personal history of other diseases of the nervous system and sense organs: Secondary | ICD-10-CM | POA: Diagnosis not present

## 2015-11-14 DIAGNOSIS — Z3202 Encounter for pregnancy test, result negative: Secondary | ICD-10-CM | POA: Diagnosis not present

## 2015-11-14 DIAGNOSIS — N3000 Acute cystitis without hematuria: Secondary | ICD-10-CM | POA: Diagnosis not present

## 2015-11-14 HISTORY — DX: Cerebral palsy, unspecified: G80.9

## 2015-11-14 LAB — URINE MICROSCOPIC-ADD ON

## 2015-11-14 LAB — URINALYSIS, ROUTINE W REFLEX MICROSCOPIC
Bilirubin Urine: NEGATIVE
GLUCOSE, UA: NEGATIVE mg/dL
KETONES UR: NEGATIVE mg/dL
Nitrite: NEGATIVE
PROTEIN: NEGATIVE mg/dL
Specific Gravity, Urine: 1.006 (ref 1.005–1.030)
pH: 7.5 (ref 5.0–8.0)

## 2015-11-14 LAB — POC URINE PREG, ED: Preg Test, Ur: NEGATIVE

## 2015-11-14 MED ORDER — NITROFURANTOIN MONOHYD MACRO 100 MG PO CAPS
100.0000 mg | ORAL_CAPSULE | Freq: Two times a day (BID) | ORAL | Status: DC
Start: 2015-11-14 — End: 2020-03-21

## 2015-11-14 NOTE — ED Notes (Signed)
Pt states that since last Tuesday she has had pain when she urinates. Denies vaginal discharge. Alert and oriented.

## 2015-11-14 NOTE — Discharge Instructions (Signed)

## 2015-11-14 NOTE — ED Provider Notes (Signed)
CSN: [id number]     Arrival date & time 11/14/15  1956 History   First MD Initiated Contact with Patient 11/14/15 2144     Chief Complaint  Patient presents with  . Dysuria     (Consider location/radiation/quality/duration/timing/severity/associated sxs/prior Treatment) HPI Comments: 20 year old female with a history of developmental delay and cerebral palsy who presents with dysuria. She reports 6 days of pain with urination. No associated abdominal pain or vaginal discharge. She is currently on her menstrual period. She endorses intermittent nausea but no vomiting or diarrhea. No fevers. She otherwise feels well.  Patient is a 20 y.o. female presenting with dysuria. The history is provided by the patient.  Dysuria   Past Medical History  Diagnosis Date  . Developmental delay   . Cerebral palsy [facility])    Past Surgical History  Procedure Laterality Date  . Laparoscopic revision ventricular-peritoneal (v-p) shunt     No family history on file. Social History  Substance Use Topics  . Smoking status: Never Smoker   . Smokeless tobacco: Not on file  . Alcohol Use: No   OB History    No data available     Review of Systems  Genitourinary: Positive for dysuria.   10 Systems reviewed and are negative for acute change except as noted in the HPI.    Allergies  Review of patient's allergies indicates no known allergies.  Home Medications   Prior to Admission medications   Medication Sig Start Date End Date Taking? Authorizing Provider  nitrofurantoin, macrocrystal-monohydrate, (MACROBID) 100 MG capsule Take 1 capsule (100 mg total) by mouth 2 (two) times daily. 11/14/15   Ambrose Finland Kiva Norland, MD   BP 120/75 mmHg  Pulse 68  Temp(Src) 98.5 F (36.9 C) (Oral)  Resp 16  Ht 5' (1.524 m)  Wt 144 lb (65.318 kg)  BMI 28.12 kg/m2  SpO2 100%  LMP 10/14/2015 (Approximate) Physical Exam  Constitutional: She is oriented to person, place, and time. She appears well-developed and  well-nourished. No distress.  HENT:  Head: Normocephalic and atraumatic.  Moist mucous membranes  Eyes: Conjunctivae are normal. Pupils are equal, round, and reactive to light.  Neck: Neck supple.  Cardiovascular: Normal rate, regular rhythm and normal heart sounds.   No murmur heard. Pulmonary/Chest: Effort normal and breath sounds normal.  Abdominal: Soft. Bowel sounds are normal. She exhibits no distension. There is no tenderness.  Musculoskeletal: She exhibits no edema.  L hand contracture and weakness  Neurological: She is alert and oriented to person, place, and time.  Fluent speech  Skin: Skin is warm and dry.  Psychiatric: She has a normal mood and affect. Judgment normal.  Nursing note and vitals reviewed.   ED Course  Procedures (including critical care time) Labs Review Labs Reviewed  URINALYSIS, ROUTINE W REFLEX MICROSCOPIC (NOT AT [facility]) - Abnormal; Notable for the following:    APPearance CLOUDY (*)    Hgb urine dipstick LARGE (*)    Leukocytes, UA LARGE (*)    All other components within normal limits  URINE MICROSCOPIC-ADD ON - Abnormal; Notable for the following:    Squamous Epithelial / LPF 6-30 (*)    Bacteria, UA MANY (*)    All other components within normal limits  URINE CULTURE  POC URINE PREG, ED    Imaging Review No results found. I have personally reviewed and evaluated these lab results as part of my medical decision-making.   EKG Interpretation None      MDM   Final  diagnoses:  Acute cystitis without hematuria   Pt w/ 6d dysuria, no other symptoms. She was well appearing, normal VS. UPT negative. UA c/w infection. Gave Macrobid and reviewed return precautions. Patient voiced understanding and was discharged in satisfactory condition.  Laurence Spates, MD 11/14/15 [phone]

## 2015-11-16 LAB — URINE CULTURE: Special Requests: NORMAL

## 2016-01-05 ENCOUNTER — Other Ambulatory Visit: Payer: Self-pay | Admitting: Nurse Practitioner

## 2016-01-05 ENCOUNTER — Ambulatory Visit
Admission: RE | Admit: 2016-01-05 | Discharge: 2016-01-05 | Disposition: A | Discharge status: Home / Self Care | Payer: Medicaid Other | Source: Ambulatory Visit | Attending: Nurse Practitioner | Admitting: Nurse Practitioner

## 2016-01-05 DIAGNOSIS — M79672 Pain in left foot: Secondary | ICD-10-CM

## 2016-01-05 DIAGNOSIS — M25572 Pain in left ankle and joints of left foot: Secondary | ICD-10-CM

## 2016-01-05 DIAGNOSIS — R609 Edema, unspecified: Secondary | ICD-10-CM

## 2016-01-23 ENCOUNTER — Emergency Department (HOSPITAL_COMMUNITY)
Admission: EM | Admit: 2016-01-23 | Discharge: 2016-01-24 | Disposition: A | Discharge status: Home / Self Care | Payer: Medicaid Other | Attending: Emergency Medicine | Admitting: Emergency Medicine

## 2016-01-23 ENCOUNTER — Encounter (HOSPITAL_COMMUNITY): Payer: Self-pay | Admitting: Emergency Medicine

## 2016-01-23 DIAGNOSIS — Y92410 Unspecified street and highway as the place of occurrence of the external cause: Secondary | ICD-10-CM | POA: Insufficient documentation

## 2016-01-23 DIAGNOSIS — S239XXA Sprain of unspecified parts of thorax, initial encounter: Secondary | ICD-10-CM | POA: Diagnosis not present

## 2016-01-23 DIAGNOSIS — Z8669 Personal history of other diseases of the nervous system and sense organs: Secondary | ICD-10-CM | POA: Diagnosis not present

## 2016-01-23 DIAGNOSIS — S299XXA Unspecified injury of thorax, initial encounter: Secondary | ICD-10-CM | POA: Diagnosis present

## 2016-01-23 DIAGNOSIS — S8002XA Contusion of left knee, initial encounter: Secondary | ICD-10-CM

## 2016-01-23 DIAGNOSIS — S161XXA Strain of muscle, fascia and tendon at neck level, initial encounter: Secondary | ICD-10-CM | POA: Insufficient documentation

## 2016-01-23 DIAGNOSIS — Z792 Long term (current) use of antibiotics: Secondary | ICD-10-CM | POA: Insufficient documentation

## 2016-01-23 DIAGNOSIS — Y9389 Activity, other specified: Secondary | ICD-10-CM | POA: Insufficient documentation

## 2016-01-23 DIAGNOSIS — Y998 Other external cause status: Secondary | ICD-10-CM | POA: Insufficient documentation

## 2016-01-23 MED ORDER — IBUPROFEN 200 MG PO TABS
600.0000 mg | ORAL_TABLET | Freq: Once | ORAL | Status: AC
  Administered 2016-01-24: 600 mg via ORAL
  Filled 2016-01-23: qty 3

## 2016-01-23 MED ORDER — CYCLOBENZAPRINE HCL 10 MG PO TABS
5.0000 mg | ORAL_TABLET | Freq: Once | ORAL | Status: AC
  Administered 2016-01-24: 5 mg via ORAL
  Filled 2016-01-23: qty 1
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
## 2016-01-24 MED ORDER — IBUPROFEN 600 MG PO TABS: 600.0000 mg | ORAL_TABLET | Freq: Four times a day (QID) | ORAL | Status: DC | PRN

## 2016-01-24 MED ORDER — CYCLOBENZAPRINE HCL 5 MG PO TABS: 5.0000 mg | ORAL_TABLET | Freq: Three times a day (TID) | ORAL | Status: DC | PRN

## 2016-01-24 NOTE — Discharge Instructions (Signed)
Contusion A contusion is a deep bruise. Contusions happen when an injury causes bleeding under the skin. Symptoms of bruising include pain, swelling, and discolored skin. The skin may turn blue, purple, or yellow. HOME CARE  1. Rest the injured area. 2. If told, put ice on the injured area. 1. Put ice in a plastic bag. 2. Place a towel between your skin and the bag. 3. Leave the ice on for 20 minutes, 2-3 times per day. 3. If told, put light pressure (compression) on the injured area using an elastic bandage. Make sure the bandage is not too tight. Remove it and put it back on as told by your doctor. 4. If possible, raise (elevate) the injured area above the level of your heart while you are sitting or lying down. 5. Take over-the-counter and prescription medicines only as told by your doctor. GET HELP IF: 1. Your symptoms do not get better after several days of treatment. 2. Your symptoms get worse. 3. You have trouble moving the injured area. GET HELP RIGHT AWAY IF:  1. You have very bad pain. 2. You have a loss of feeling (numbness) in a hand or foot. 3. Your hand or foot turns pale or cold.   This information is not intended to replace advice given to you by your health care provider. Make sure you discuss any questions you have with your health care provider.   Document Released: 03/19/2008 Document Revised: 06/22/2015 Document Reviewed: 02/16/2015 Elsevier Interactive Patient Education 2016 [employer]  Back Exercises If you have pain in your back, do these exercises 2-3 times each day or as told by your doctor. When the pain goes away, do the exercises once each day, but repeat the steps more times for each exercise (do more repetitions). If you do not have pain in your back, do these exercises once each day or as told by your doctor. EXERCISES Single Knee to Chest Do these steps 3-5 times in a row for each leg: 6. Lie on your back on a firm bed or the floor with your legs  stretched out. 7. Bring one knee to your chest. 8. Hold your knee to your chest by grabbing your knee or thigh. 9. Pull on your knee until you feel a gentle stretch in your lower back. 10. Keep doing the stretch for 10-30 seconds. 11. Slowly let go of your leg and straighten it. Pelvic Tilt Do these steps 5-10 times in a row: 4. Lie on your back on a firm bed or the floor with your legs stretched out. 5. Bend your knees so they point up to the ceiling. Your feet should be flat on the floor. 6. Tighten your lower belly (abdomen) muscles to press your lower back against the floor. This will make your tailbone point up to the ceiling instead of pointing down to your feet or the floor. 7. Stay in this position for 5-10 seconds while you gently tighten your muscles and breathe evenly. Cat-Cow Do these steps until your lower back bends more easily: 4. Get on your hands and knees on a firm surface. Keep your hands under your shoulders, and keep your knees under your hips. You may put padding under your knees. 5. Let your head hang down, and make your tailbone point down to the floor so your lower back is round like the back of a cat. 6. Stay in this position for 5 seconds. 7. Slowly lift your head and make your tailbone point up to  the ceiling so your back hangs low (sags) like the back of a cow. 8. Stay in this position for 5 seconds. Press-Ups Do these steps 5-10 times in a row: 1. Lie on your belly (face-down) on the floor. 2. Place your hands near your head, about shoulder-width apart. 3. While you keep your back relaxed and keep your hips on the floor, slowly straighten your arms to raise the top half of your body and lift your shoulders. Do not use your back muscles. To make yourself more comfortable, you may change where you place your hands. 4. Stay in this position for 5 seconds. 5. Slowly return to lying flat on the floor. Bridges Do these steps 10 times in a row: 1. Lie on your back  on a firm surface. 2. Bend your knees so they point up to the ceiling. Your feet should be flat on the floor. 3. Tighten your butt muscles and lift your butt off of the floor until your waist is almost as high as your knees. If you do not feel the muscles working in your butt and the back of your thighs, slide your feet 1-2 inches farther away from your butt. 4. Stay in this position for 3-5 seconds. 5. Slowly lower your butt to the floor, and let your butt muscles relax. If this exercise is too easy, try doing it with your arms crossed over your chest. Belly Crunches Do these steps 5-10 times in a row: 1. Lie on your back on a firm bed or the floor with your legs stretched out. 2. Bend your knees so they point up to the ceiling. Your feet should be flat on the floor. 3. Cross your arms over your chest. 4. Tip your chin a little bit toward your chest but do not bend your neck. 5. Tighten your belly muscles and slowly raise your chest just enough to lift your shoulder blades a tiny bit off of the floor. 6. Slowly lower your chest and your head to the floor. Back Lifts Do these steps 5-10 times in a row: 1. Lie on your belly (face-down) with your arms at your sides, and rest your forehead on the floor. 2. Tighten the muscles in your legs and your butt. 3. Slowly lift your chest off of the floor while you keep your hips on the floor. Keep the back of your head in line with the curve in your back. Look at the floor while you do this. 4. Stay in this position for 3-5 seconds. 5. Slowly lower your chest and your face to the floor. GET HELP IF:  Your back pain gets a lot worse when you do an exercise.  Your back pain does not lessen 2 hours after you exercise. If you have any of these problems, stop doing the exercises. Do not do them again unless your doctor says it is okay. GET HELP RIGHT AWAY IF:  You have sudden, very bad back pain. If this happens, stop doing the exercises. Do not do them  again unless your doctor says it is okay.   This information is not intended to replace advice given to you by your health care provider. Make sure you discuss any questions you have with your health care provider.   Document Released: 11/03/2010 Document Revised: 06/22/2015 Document Reviewed: 11/25/2014 Elsevier Interactive Patient Education [employer]

## 2017-05-13 ENCOUNTER — Other Ambulatory Visit: Payer: Self-pay | Admitting: Family Medicine

## 2017-05-13 DIAGNOSIS — N63 Unspecified lump in unspecified breast: Secondary | ICD-10-CM

## 2017-05-21 ENCOUNTER — Ambulatory Visit
Admission: RE | Admit: 2017-05-21 | Discharge: 2017-05-21 | Disposition: A | Discharge status: Home / Self Care | Payer: Medicaid Other | Source: Ambulatory Visit | Attending: Family Medicine | Admitting: Family Medicine

## 2017-05-21 DIAGNOSIS — N63 Unspecified lump in unspecified breast: Secondary | ICD-10-CM

## 2018-06-06 ENCOUNTER — Emergency Department (HOSPITAL_COMMUNITY)
Admission: EM | Admit: 2018-06-06 | Discharge: 2018-06-06 | Disposition: A | Discharge status: Home / Self Care | Payer: Medicaid Other | Attending: Emergency Medicine | Admitting: Emergency Medicine

## 2018-06-06 ENCOUNTER — Other Ambulatory Visit: Payer: Self-pay

## 2018-06-06 ENCOUNTER — Emergency Department (HOSPITAL_COMMUNITY): Payer: Medicaid Other

## 2018-06-06 ENCOUNTER — Encounter (HOSPITAL_COMMUNITY): Payer: Self-pay

## 2018-06-06 DIAGNOSIS — R1031 Right lower quadrant pain: Secondary | ICD-10-CM | POA: Insufficient documentation

## 2018-06-06 DIAGNOSIS — Z79899 Other long term (current) drug therapy: Secondary | ICD-10-CM | POA: Insufficient documentation

## 2018-06-06 LAB — COMPREHENSIVE METABOLIC PANEL
ALBUMIN: 3.3 g/dL — AB (ref 3.5–5.0)
ALT: 9 U/L (ref 0–44)
AST: 12 U/L — AB (ref 15–41)
Alkaline Phosphatase: 59 U/L (ref 38–126)
Anion gap: 8 (ref 5–15)
BUN: 5 mg/dL — AB (ref 6–20)
CHLORIDE: 103 mmol/L (ref 98–111)
CO2: 29 mmol/L (ref 22–32)
Calcium: 9.1 mg/dL (ref 8.9–10.3)
Creatinine, Ser: 0.8 mg/dL (ref 0.44–1.00)
GFR calc Af Amer: >60 mL/min (ref >60)
GFR calc non Af Amer: >60 mL/min (ref >60)
GLUCOSE: 83 mg/dL (ref 70–99)
Potassium: 4.1 mmol/L (ref 3.5–5.1)
SODIUM: 140 mmol/L (ref 135–145)
Total Bilirubin: 0.8 mg/dL (ref 0.3–1.2)
Total Protein: 7.9 g/dL (ref 6.5–8.1)

## 2018-06-06 LAB — CBC
HEMATOCRIT: 42 % (ref 36.0–46.0)
Hemoglobin: 13 g/dL (ref 12.0–15.0)
MCH: 26.9 pg (ref 26.0–34.0)
MCHC: 31 g/dL (ref 30.0–36.0)
MCV: 87 fL (ref 78.0–100.0)
Platelets: 339 10*3/uL (ref 150–400)
RBC: 4.83 MIL/uL (ref 3.87–5.11)
RDW: 13.2 % (ref 11.5–15.5)
WBC: 6.6 10*3/uL (ref 4.0–10.5)

## 2018-06-06 LAB — URINALYSIS, ROUTINE W REFLEX MICROSCOPIC
GLUCOSE, UA: NEGATIVE mg/dL
Hgb urine dipstick: NEGATIVE
Ketones, ur: NEGATIVE mg/dL
LEUKOCYTES UA: NEGATIVE
Nitrite: NEGATIVE
PH: 8 (ref 5.0–8.0)
Protein, ur: NEGATIVE mg/dL
Specific Gravity, Urine: 1.015 (ref 1.005–1.030)

## 2018-06-06 LAB — I-STAT BETA HCG BLOOD, ED (MC, WL, AP ONLY): I-stat hCG, quantitative: <5 m[IU]/mL (ref <5)

## 2018-06-06 LAB — LIPASE, BLOOD: LIPASE: 30 U/L (ref 11–51)

## 2018-06-06 MED ORDER — IOPAMIDOL (ISOVUE-300) INJECTION 61%
INTRAVENOUS | Status: AC
  Administered 2018-06-06: 100 mL
  Filled 2018-06-06: qty 100

## 2018-06-06 MED ORDER — KETOROLAC TROMETHAMINE 30 MG/ML IJ SOLN
30.0000 mg | Freq: Once | INTRAMUSCULAR | Status: AC
  Administered 2018-06-06: 30 mg via INTRAVENOUS
  Filled 2018-06-06: qty 1

## 2018-06-06 NOTE — ED Notes (Signed)
Patient transported to CT 

## 2018-06-06 NOTE — ED Notes (Signed)
Contacted [facility] for neurosurgery

## 2018-06-06 NOTE — ED Notes (Signed)
Declined W/C at D/C and was escorted to lobby by RN. 

## 2018-06-06 NOTE — ED Provider Notes (Signed)
[name] EMERGENCY DEPARTMENT Provider Note   CSN: [id number] Arrival date & time: 06/06/18  1110     History   Chief Complaint Chief Complaint  Patient presents with  . Abdominal Pain    HPI Carol Wheeler is a 22 y.o. female past medical cerebral palsy, developmental delay who presents for evaluation of 1 week of right lower quadrant abdominal pain.  She states that pain has been constant but worsened over the last few days.  She states the pain is worse at night.  She has been able to eat and drink and denies any nausea/vomiting.  Patient reports she has been taking Advil and ibuprofen for pain relief with no improvement in symptoms.  Patient was seen by her primary care doctor today who advised that she come to the emergency department for further evaluation.  Patient states that she has not had any fever.  She reports she has an abdominal shot but denies any other abdominal surgery.  Patient denies any chest pain, difficulty breathing, dysuria, hematuria, nausea/vomiting, vaginal bleeding, vaginal discharge.  The history is provided by the patient.    Past Medical History:  Diagnosis Date  . Cerebral palsy (HCC)   . Developmental delay     Patient Active Problem List   Diagnosis Date Noted  . Cerebral palsy (HCC) 06/29/2013    Past Surgical History:  Procedure Laterality Date  . LAPAROSCOPIC REVISION VENTRICULAR-PERITONEAL (V-P) SHUNT       OB History   None      Home Medications    Prior to Admission medications   Medication Sig Start Date End Date Taking? Authorizing Provider  cyclobenzaprine (FLEXERIL) 5 MG tablet Take 1 tablet (5 mg total) by mouth 3 (three) times daily as needed for muscle spasms. 01/24/16   Earley Favor, NP  ibuprofen (ADVIL,MOTRIN) 600 MG tablet Take 1 tablet (600 mg total) by mouth every 6 (six) hours as needed. 01/24/16   Earley Favor, NP  nitrofurantoin, macrocrystal-monohydrate, (MACROBID) 100 MG capsule Take 1  capsule (100 mg total) by mouth 2 (two) times daily. 11/14/15   Little, Ambrose Finland, MD  pantoprazole (PROTONIX) 20 MG tablet Take 1 tablet (20 mg total) by mouth daily. Patient not taking: Reported on 11/14/2015 10/27/14 11/14/15  Loren Racer, MD  sucralfate (CARAFATE) 1 GM/10ML suspension Take 10 mLs (1 g total) by mouth 3 (three) times daily as needed. Patient not taking: Reported on 11/14/2015 10/27/14 11/14/15  Loren Racer, MD    Family History History reviewed. No pertinent family history.  Social History Social History   Tobacco Use  . Smoking status: Never Smoker  . Smokeless tobacco: Never Used  Substance Use Topics  . Alcohol use: No  . Drug use: No     Allergies   Patient has no known allergies.   Review of Systems Review of Systems  Constitutional: Negative for fever.  Respiratory: Negative for cough and shortness of breath.   Cardiovascular: Negative for chest pain.  Gastrointestinal: Positive for abdominal pain. Negative for nausea and vomiting.  Genitourinary: Negative for dysuria, hematuria, vaginal bleeding and vaginal discharge.  Neurological: Negative for headaches.     Physical Exam Updated Vital Signs BP 117/88   Pulse 76   Temp 98.3 F (36.8 C) (Oral)   Resp 16   Ht 5\' 1"  (1.549 m)   Wt 65.3 kg   LMP 05/23/2018 (Exact Date)   SpO2 97%   BMI 27.21 kg/m   Physical Exam  Constitutional: She is  oriented to person, place, and time. She appears well-developed and well-nourished.  Sitting comfortably on examination table  HENT:  Head: Normocephalic and atraumatic.  Mouth/Throat: Oropharynx is clear and moist and mucous membranes are normal.  Eyes: Pupils are equal, round, and reactive to light. Conjunctivae, EOM and lids are normal.  Neck: Full passive range of motion without pain.  Cardiovascular: Normal rate, regular rhythm, normal heart sounds and normal pulses. Exam reveals no gallop and no friction rub.  No murmur  heard. Pulmonary/Chest: Effort normal and breath sounds normal.  Abdominal: Soft. Normal appearance. There is tenderness in the right lower quadrant. There is tenderness at McBurney's point. There is no rigidity and no guarding.  Abdomen is soft, nondistended.  Tenderness noted to the right lower quadrant at McBurney's point.  No CVA tenderness bilaterally.  Musculoskeletal: Normal range of motion.  Neurological: She is alert and oriented to person, place, and time.  Skin: Skin is warm and dry. Capillary refill takes less than 2 seconds.  Psychiatric: She has a normal mood and affect. Her speech is normal.  Nursing note and vitals reviewed.    ED Treatments / Results  Labs (all labs ordered are listed, but only abnormal results are displayed) Labs Reviewed  COMPREHENSIVE METABOLIC PANEL - Abnormal; Notable for the following components:      Result Value   BUN 5 (*)    Albumin 3.3 (*)    AST 12 (*)    All other components within normal limits  URINALYSIS, ROUTINE W REFLEX MICROSCOPIC - Abnormal; Notable for the following components:   APPearance HAZY (*)    Bilirubin Urine SMALL (*)    All other components within normal limits  LIPASE, BLOOD  CBC  I-STAT BETA HCG BLOOD, ED ([facility], [facility], [facility] ONLY)    EKG None  Radiology Ct Abdomen Pelvis W Contrast  Result Date: 06/06/2018 CLINICAL DATA:  Initial evaluation for acute right lower quadrant abdominal pain. EXAM: CT ABDOMEN AND PELVIS WITH CONTRAST TECHNIQUE: Multidetector CT imaging of the abdomen and pelvis was performed using the standard protocol following bolus administration of intravenous contrast. CONTRAST:  ISOVUE-300 IOPAMIDOL (ISOVUE-300) INJECTION 61% COMPARISON:  None available. FINDINGS: Lower chest: Visualized lung bases are clear. Hepatobiliary: Over demonstrates a normal contrast enhanced appearance. Gallbladder within normal limits. No biliary dilatation. Pancreas: Pancreas within normal limits. Spleen: Spleen within  normal limits. Adrenals/Urinary Tract: Adrenal glands are normal. Kidneys equal in size with symmetric enhancement. No nephrolithiasis, hydronephrosis, or focal enhancing renal mass. No hydroureter. Partially distended bladder within normal limits. Stomach/Bowel: Stomach within normal limits. No evidence for bowel obstruction. There is inflammatory stranding within the right lower quadrant adjacent to the terminal ileum and cecum, favored to be related to the adjacent VP shunt catheter rather than the bowel itself. Appendix present within this region, and is within normal limits for caliber without definite evidence for acute appendicitis. No other acute inflammatory changes seen about the bowels. Vascular/Lymphatic: Normal intravascular enhancement seen throughout the intra-abdominal aorta and its branch vessels. No adenopathy. Reproductive: Uterus and ovaries within normal limits for age. Other: VP shunt catheter in place, extending through the left ventral abdomen. Catheter seen coursing inferiorly into the right lower quadrant, with tip terminating in the right hemipelvis near the right ovary. There appears to be a discontinuous piece of the catheter seen coursing from the lower mid abdomen through the pelvis and superiorly within the right abdomen. Acute inflammatory changes seen about the shunt catheter pieces within the right abdomen (  series 3, image 53). Additionally, free fluid which appears somewhat loculated and rim enhancing within the mid pelvis (pouch of Douglas) (series 3, image 62). Findings suggestive of acute pelvic peritonitis, which could be either infectious or inflammatory. Difficult to exclude developing abscess and pouch of Douglas. Small volume free fluid within the left lower quadrant and adjacent to the spleen also consistent with shunt. No free intraperitoneal air. Musculoskeletal: No acute osseous abnormality. No worrisome lytic or blastic osseous lesions. IMPRESSION: 1. VP shunt  catheter in place, with discontinuous catheter piece within the lower mid and right abdomen as above, either fractured or orphaned from prior VP shunt. Evidence for inflammatory changes adjacent to the catheter pieces within the right abdomen, concerning for acute peritonitis, either infectious or inflammatory. Associated collection within the pouch of Douglas with associated rim enhancement and mildly loculated appearance, which could reflect developing/early abscess. 2. Normal appendix. 3. No other acute abnormality within the abdomen and pelvis. Electronically Signed   By: Rise Mu M.D.   On: 06/06/2018 15:17    Procedures Procedures (including critical care time)  Medications Ordered in ED Medications  ketorolac (TORADOL) 30 MG/ML injection 30 mg (30 mg Intravenous Given 06/06/18 1504)  iopamidol (ISOVUE-300) 61 % injection (100 mLs  Contrast Given 06/06/18 1426)     Initial Impression / Assessment and Plan / ED Course  I have reviewed the triage vital signs and the nursing notes.  Pertinent labs & imaging results that were available during my care of the patient were reviewed by me and considered in my medical decision making (see chart for details).     22 year old female who presents for evaluation of right lower quadrant abdominal pain that has been ongoing for 1 week.  No associated fevers, nausea/vomiting.  Reports went to primary care doctor today who told her to come to the ED for further evaluation. Patient is afebrile, non-toxic appearing, sitting comfortably on examination table. Vital signs reviewed and stable.  On exam, patient is tenderness to the right lower quadrant at McBurney's point.  Initial labs ordered at triage.  Considered viral GI process versus UTI versus appendicitis.  Given concerns, will get CT on pelvis for evaluation.  I-STAT beta negative.  UA shows small bilirubin otherwise no acute abnormalities.  CBC with no leukocytosis or anemia.  Lipase  unremarkable.  CMP is unremarkable.  CT and pelvis shows VP shunt catheter in place but is concerned for either fractured or broken VP shunt.  They do mention there is some premature changes adjacent to the catheter pieces with a questionable area of loculated appearance which could represent early abscess.  He is not febrile has no evidence of leukocytosis. Normal appendix.  Will touch base with neurosurgery.  Patient has been seen by Dr. Holley Dexter at [facility] neurosurgery.  Discussed patient with Dr. Yetta Barre [facility]).  He recommends contacting patient's neurosurgeon office at [facility] given that they have seen patient in her familiar with patient's history.  Discussed with Dr. Andrey Campanile [facility] neurosurgery).  He recommends having patient come to [facility] ED so they can evaluate her in the ED.  He asked that patient come with a CD of her images.   Discussed patient with Dr. Salvatore Marvel [facility] ED).  Accepts [facility] for transfer to [facility] with plans for neurosurgery to consult on patient's arrival.  Given patient is hemodynamically stable status and reassuring work-up, patient can go POV.  Discussed plan with patient and mom.  Mom agrees to take  patient over to [facility] immediately.  Instructed mom to go [facility] for further evaluation and neurosurgery consult.  Mom expresses understanding. Patient sent with images and reports of CT scan.   Final Clinical Impressions(s) / ED Diagnoses   Final diagnoses:  Right lower quadrant abdominal pain    ED Discharge Orders    None       Rosana HoesLayden, Lindsey A, PA-C 06/06/18 1702    Charlynne PanderYao, David Hsienta, MD 06/08/18 2117

## 2018-06-06 NOTE — Discharge Instructions (Signed)
Go directly to [facility].  They will contact the neurosurgery office when you get there.

## 2018-06-06 NOTE — ED Triage Notes (Signed)
Pt presents with 1 week h/o RLQ abdominal pain.  Pt reports pain has been constant, will subside with ibuprofen but pain returns.  Pt denies that pain radiates; denies any nausea, vomiting, diarrhea, dysuria or vaginal discharge.

## 2018-11-20 IMAGING — CT CT ABD-PELV W/ CM
2 of 4 series · 15 of 46 positions shown, 17 images · IV contrast (iopamidol)
Comparison: None available.

CLINICAL DATA: Initial evaluation for acute right lower quadrant
abdominal pain.

EXAM:
CT ABDOMEN AND PELVIS WITH CONTRAST
TECHNIQUE: Multidetector CT imaging of the abdomen and pelvis was performed
using the standard protocol following bolus administration of
intravenous contrast.
CONTRAST:  100mL S0FI2G-KFF IOPAMIDOL (S0FI2G-KFF) INJECTION 61%

[Series 3: abdomen 5.0 · axial · 0.58mm/px · z∈[+774,+1134]mm · 12 of 82 slices shown, 14 images]
[im 5/82  soft-tissue]
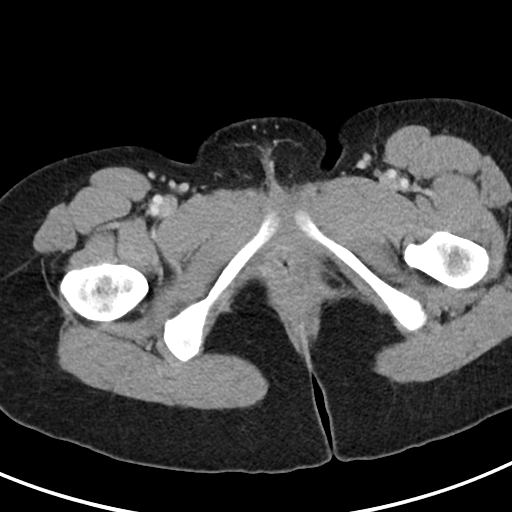
[im 5/82  bone]
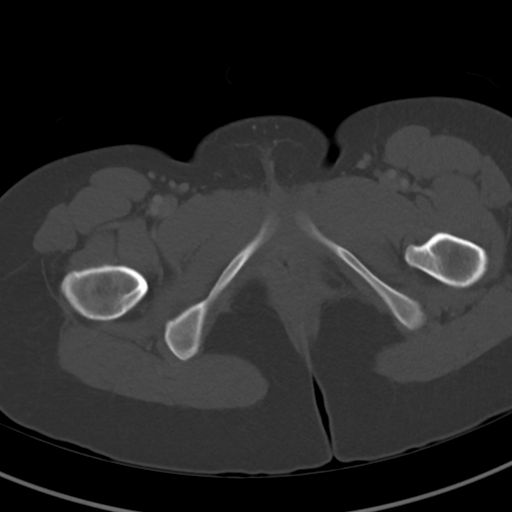
[im 13/82  soft-tissue]
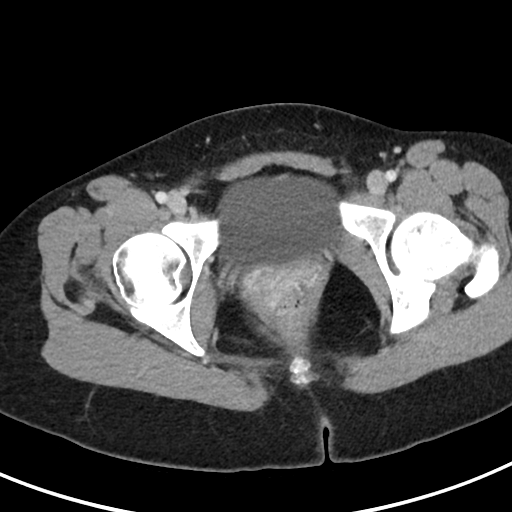
[im 17/82  soft-tissue]
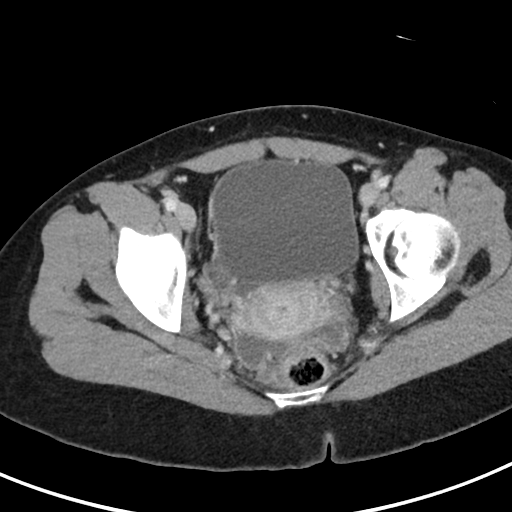
[im 25/82  soft-tissue]
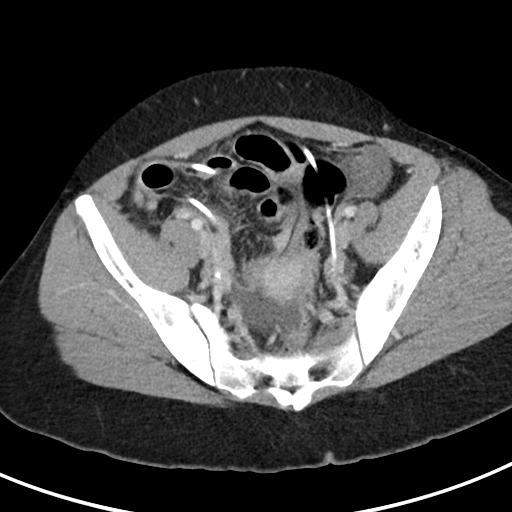
[im 33/82  soft-tissue]
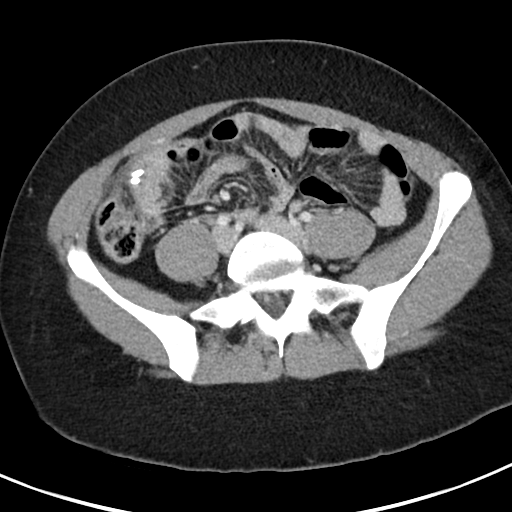
[im 37/82  soft-tissue]
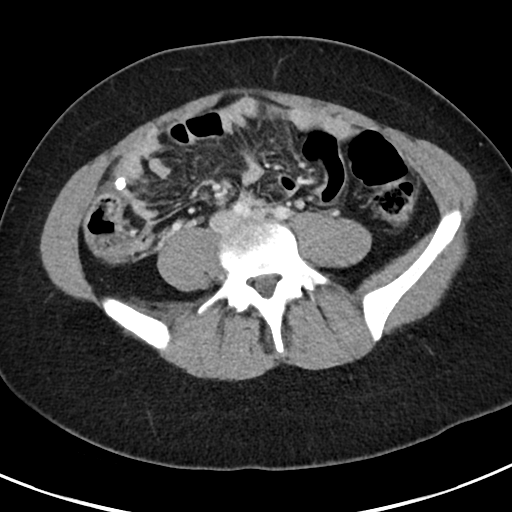
[im 45/82  soft-tissue]
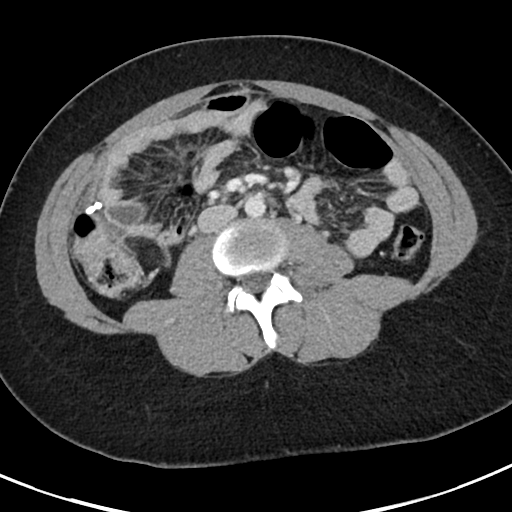
[im 49/82  soft-tissue]
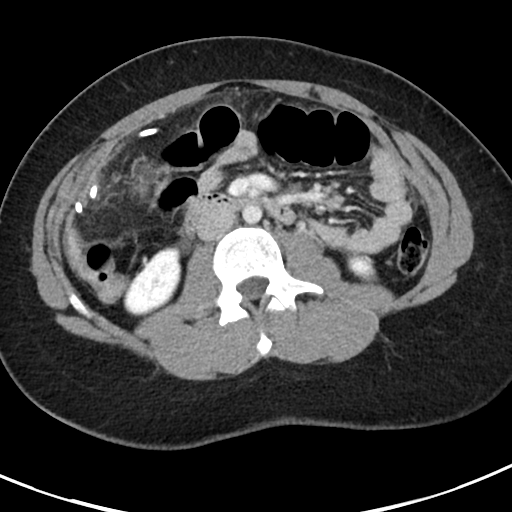
[im 57/82  soft-tissue]
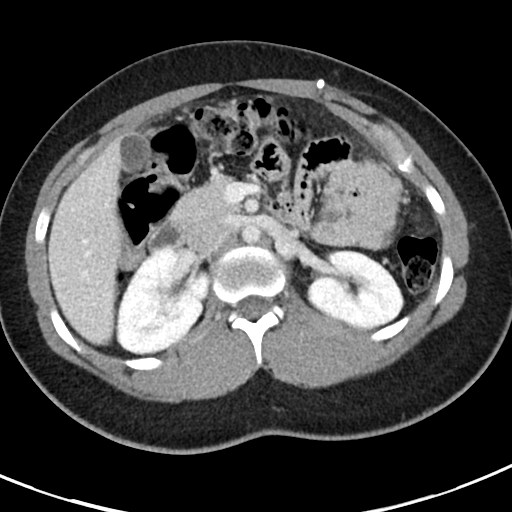
[im 57/82  bone]
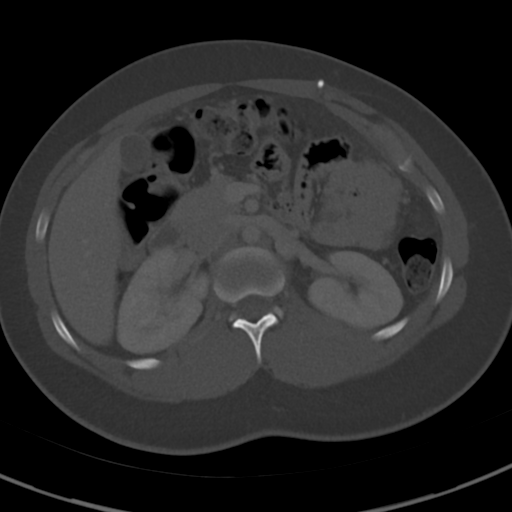
[im 65/82  soft-tissue]
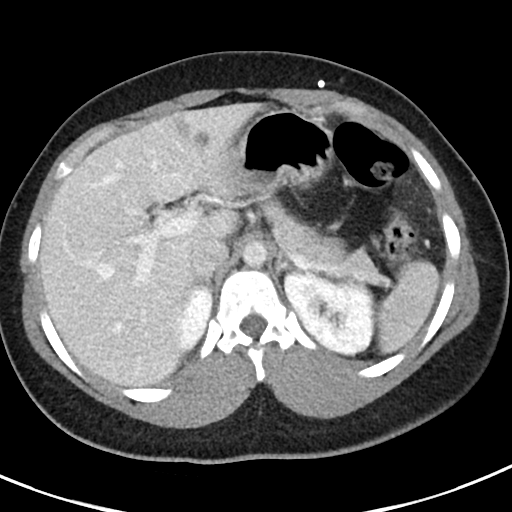
[im 69/82  soft-tissue]
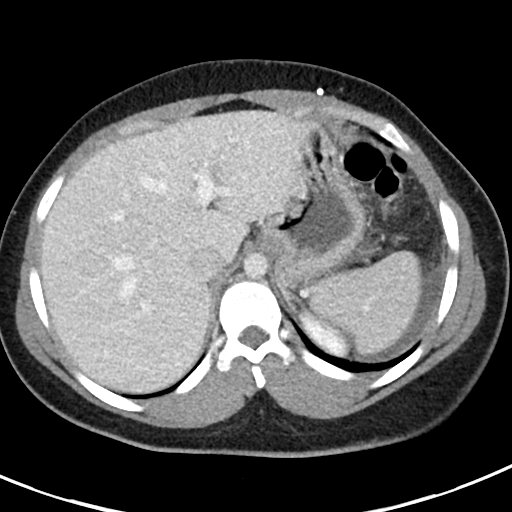
[im 77/82  soft-tissue]
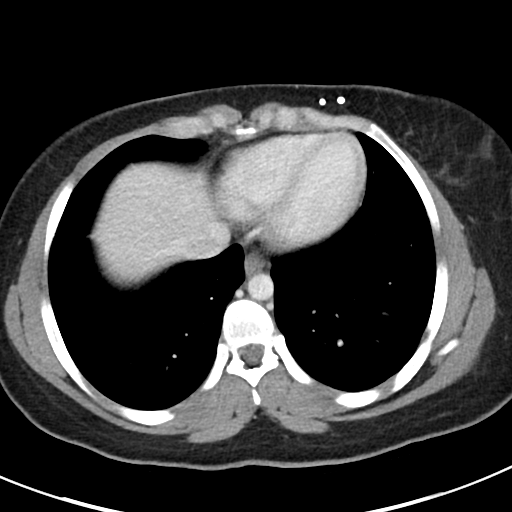

[Series 6: abdomen 3.0 mpr cor · coronal · 0.66mm/px · 3 of 84 slices shown]
[im 28/84  soft-tissue]
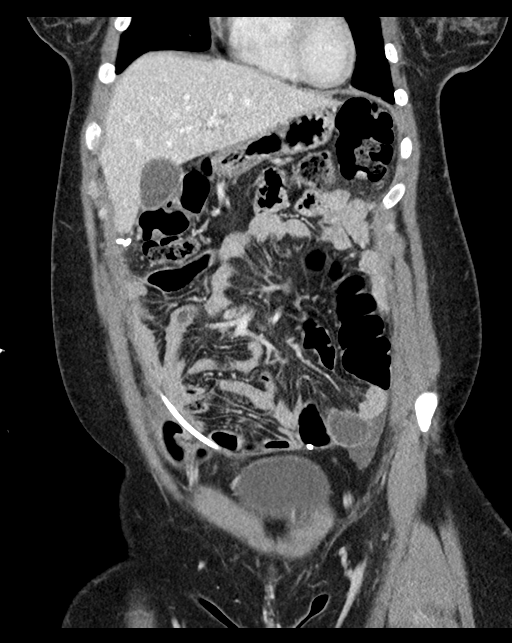
[im 37/84  soft-tissue]
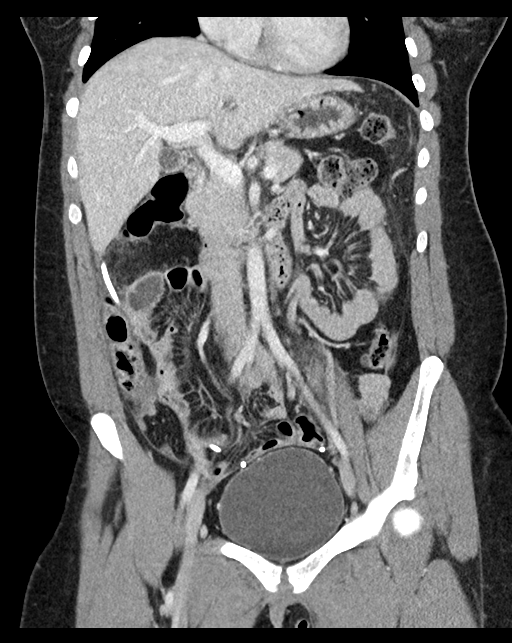
[im 47/84  soft-tissue]
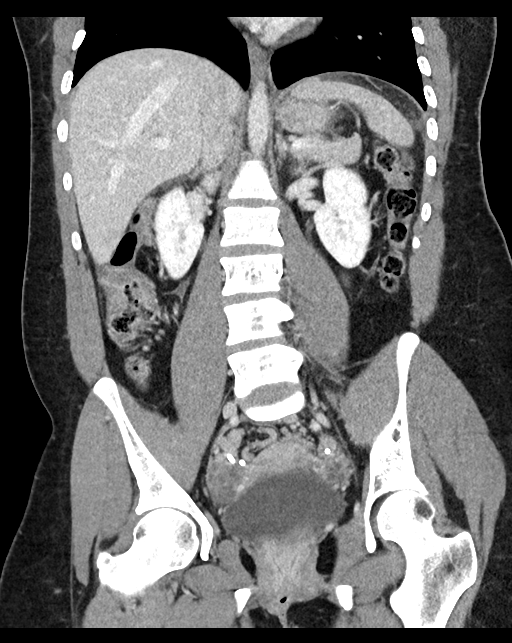

[15 of 46 positions shown; findings below may reference images not displayed]

FINDINGS: Lower chest: Visualized lung bases are clear.

Hepatobiliary: Over demonstrates a normal contrast enhanced
appearance. Gallbladder within normal limits. No biliary dilatation.

Pancreas: Pancreas within normal limits.

Spleen: Spleen within normal limits.

Adrenals/Urinary Tract: Adrenal glands are normal. Kidneys equal in
size with symmetric enhancement. No nephrolithiasis, hydronephrosis,
or focal enhancing renal mass. No hydroureter. Partially distended
bladder within normal limits.

Stomach/Bowel: Stomach within normal limits. No evidence for bowel
obstruction. There is inflammatory stranding within the right lower
quadrant adjacent to the terminal ileum and cecum, favored to be
related to the adjacent VP shunt catheter rather than the bowel
itself. Appendix present within this region, and is within normal
limits for caliber without definite evidence for acute appendicitis.
No other acute inflammatory changes seen about the bowels.

Vascular/Lymphatic: Normal intravascular enhancement seen throughout
the intra-abdominal aorta and its branch vessels. No adenopathy.

Reproductive: Uterus and ovaries within normal limits for age.

Other: VP shunt catheter in place, extending through the left
ventral abdomen. Catheter seen coursing inferiorly into the right
lower quadrant, with tip terminating in the right hemipelvis near
the right ovary. There appears to be a discontinuous piece of the
catheter seen coursing from the lower mid abdomen through the pelvis
and superiorly within the right abdomen. Acute inflammatory changes
seen about the shunt catheter pieces within the right abdomen
(series 3, image 53). Additionally, free fluid which appears
somewhat loculated and rim enhancing within the mid pelvis (pouch of
Huy) (series 3, image 62). Findings suggestive of acute pelvic
peritonitis, which could be either infectious or inflammatory.
Difficult to exclude developing abscess and pouch of Huy. Small
volume free fluid within the left lower quadrant and adjacent to the
spleen also consistent with shunt. No free intraperitoneal air.

Musculoskeletal: No acute osseous abnormality. No worrisome lytic or
blastic osseous lesions.
IMPRESSION: 1. VP shunt catheter in place, with discontinuous catheter piece
within the lower mid and right abdomen as above, either fractured or
orphaned from prior VP shunt. Evidence for inflammatory changes
adjacent to the catheter pieces within the right abdomen, concerning
for acute peritonitis, either infectious or inflammatory. Associated
collection within the pouch of Huy with associated rim
enhancement and mildly loculated appearance, which could reflect
developing/early abscess.
2. Normal appendix.
3. No other acute abnormality within the abdomen and pelvis.

## 2019-07-29 ENCOUNTER — Ambulatory Visit: Payer: Self-pay | Admitting: Adult Health Nurse Practitioner

## 2019-09-29 ENCOUNTER — Encounter (HOSPITAL_COMMUNITY): Payer: Self-pay

## 2019-09-29 ENCOUNTER — Ambulatory Visit (HOSPITAL_COMMUNITY)
Admission: EM | Admit: 2019-09-29 | Discharge: 2019-09-29 | Disposition: A | Discharge status: Home / Self Care | Payer: Medicaid Other | Attending: Emergency Medicine | Admitting: Emergency Medicine

## 2019-09-29 ENCOUNTER — Other Ambulatory Visit: Payer: Self-pay

## 2019-09-29 DIAGNOSIS — Z113 Encounter for screening for infections with a predominantly sexual mode of transmission: Secondary | ICD-10-CM | POA: Diagnosis present

## 2019-09-29 DIAGNOSIS — N939 Abnormal uterine and vaginal bleeding, unspecified: Secondary | ICD-10-CM | POA: Diagnosis present

## 2019-09-29 DIAGNOSIS — Z3202 Encounter for pregnancy test, result negative: Secondary | ICD-10-CM

## 2019-09-29 LAB — CBC WITH DIFFERENTIAL/PLATELET
Abs Immature Granulocytes: 0.01 10*3/uL (ref 0.00–0.07)
Basophils Absolute: 0.1 10*3/uL (ref 0.0–0.1)
Basophils Relative: 1 %
Eosinophils Absolute: 0 10*3/uL (ref 0.0–0.5)
Eosinophils Relative: 0 %
HCT: 49.8 % — ABNORMAL HIGH (ref 36.0–46.0)
Hemoglobin: 15.8 g/dL — ABNORMAL HIGH (ref 12.0–15.0)
Immature Granulocytes: 0 %
Lymphocytes Relative: 29 %
Lymphs Abs: 2 10*3/uL (ref 0.7–4.0)
MCH: 27.7 pg (ref 26.0–34.0)
MCHC: 31.7 g/dL (ref 30.0–36.0)
MCV: 87.2 fL (ref 80.0–100.0)
Monocytes Absolute: 0.4 10*3/uL (ref 0.1–1.0)
Monocytes Relative: 5 %
Neutro Abs: 4.5 10*3/uL (ref 1.7–7.7)
Neutrophils Relative %: 65 %
Platelets: 325 10*3/uL (ref 150–400)
RBC: 5.71 MIL/uL — ABNORMAL HIGH (ref 3.87–5.11)
RDW: 14 % (ref 11.5–15.5)
WBC: 7 10*3/uL (ref 4.0–10.5)
nRBC: 0 % (ref 0.0–0.2)

## 2019-09-29 LAB — POC URINE PREG, ED: Preg Test, Ur: NEGATIVE

## 2019-09-29 LAB — POCT PREGNANCY, URINE: Preg Test, Ur: NEGATIVE

## 2019-09-29 LAB — HIV ANTIBODY (ROUTINE TESTING W REFLEX): HIV Screen 4th Generation wRfx: NONREACTIVE

## 2019-09-29 MED ORDER — MEGESTROL ACETATE 40 MG PO TABS: 40.0000 mg | ORAL_TABLET | Freq: Every day | ORAL | 0 refills | Status: DC

## 2019-09-29 NOTE — ED Provider Notes (Addendum)
[facility]    CSN: [id number] Arrival date & time: 09/29/19  1025      History   Chief Complaint Chief Complaint  Patient presents with  . Vaginal Bleeding    HPI Carol Wheeler is a 23 y.o. female.   Diane Code 23 years old female presents to the urgent care refill abnormal vaginal bleeding x1 day.  Patient stated she saturated 1 pad today.  When she was taken back this morning, there was gushing blood with blood clot.  She felt like something about to come out of her vagina.  She stated her blood clot looked like a pennie size.  She is sexually active with one female and one  female partners.  Denies chills and fever, denies vaginal discharge.  The history is provided by the patient. No language interpreter was used.  Vaginal Bleeding Associated symptoms: abdominal pain   Associated symptoms: no vaginal discharge     Past Medical History:  Diagnosis Date  . Cerebral palsy (HCC)   . Developmental delay     Patient Active Problem List   Diagnosis Date Noted  . Cerebral palsy ([facility]CC) 06/29/2013    Past Surgical History:  Procedure Laterality Date  . LAPAROSCOPIC REVISION VENTRICULAR-PERITONEAL (V-P) SHUNT      OB History   No obstetric history on file.      Home Medications    Prior to Admission medications   Medication Sig Start Date End Date Taking? Authorizing Provider  cyclobenzaprine (FLEXERIL) 5 MG tablet Take 1 tablet (5 mg total) by mouth 3 (three) times daily as needed for muscle spasms. 01/24/16   Earley Favor, NP  ibuprofen (ADVIL,MOTRIN) 600 MG tablet Take 1 tablet (600 mg total) by mouth every 6 (six) hours as needed. 01/24/16   Earley Favor, NP  megestrol (MEGACE) 40 MG tablet Take 1 tablet (40 mg total) by mouth daily. 09/29/19   Kairi Tufo, Zachery Dakins, FNP  nitrofurantoin, macrocrystal-monohydrate, (MACROBID) 100 MG capsule Take 1 capsule (100 mg total) by mouth 2 (two) times daily. 11/14/15   Little, Ambrose Finland, MD    Family  History History reviewed. No pertinent family history.  Social History Social History   Tobacco Use  . Smoking status: Never Smoker  . Smokeless tobacco: Never Used  Substance Use Topics  . Alcohol use: No  . Drug use: No     Allergies   Patient has no known allergies.   Review of Systems Review of Systems  Constitutional: Negative.   HENT: Negative.   Respiratory: Negative.   Cardiovascular: Negative.   Gastrointestinal: Positive for abdominal pain.  Genitourinary: Positive for vaginal bleeding. Negative for vaginal discharge and vaginal pain.  ROS: All other are negatives   Physical Exam Triage Vital Signs ED Triage Vitals  Enc Vitals Group     BP 09/29/19 1147 130/74     Pulse Rate 09/29/19 1147 84     Resp 09/29/19 1147 18     Temp 09/29/19 1147 98.2 F (36.8 C)     Temp Source 09/29/19 1147 Oral     SpO2 09/29/19 1147 100 %     Weight 09/29/19 1149 144 lb (65.3 kg)     Height --      Head Circumference --      Peak Flow --      Pain Score 09/29/19 1149 7     Pain Loc --      Pain Edu? --      Excl. in GC? --  No data found.  Updated Vital Signs BP 130/74 (BP Location: Right Arm)   Pulse 84   Temp 98.2 F (36.8 C) (Oral)   Resp 18   Wt 144 lb (65.3 kg)   SpO2 100%   BMI 27.21 kg/m   Visual Acuity Right Eye Distance:   Left Eye Distance:   Bilateral Distance:    Right Eye Near:   Left Eye Near:    Bilateral Near:     Physical Exam Vitals and nursing note reviewed.  Constitutional:      General: She is not in acute distress.    Appearance: Normal appearance. She is normal weight. She is not ill-appearing.  Cardiovascular:     Rate and Rhythm: Normal rate and regular rhythm.     Pulses: Normal pulses.     Heart sounds: Normal heart sounds. No murmur.  Pulmonary:     Effort: Pulmonary effort is normal. No respiratory distress.     Breath sounds: Normal breath sounds. No wheezing.  Chest:     Chest wall: No tenderness.  Abdominal:      General: Abdomen is flat. Bowel sounds are normal.     Palpations: Abdomen is soft.  Genitourinary:    General: Normal vulva.     Rectum: Normal.  Neurological:     Mental Status: She is alert.      UC Treatments / Results  Labs (all labs ordered are listed, but only abnormal results are displayed) Labs Reviewed  RPR  HIV ANTIBODY (ROUTINE TESTING W REFLEX)  CBC WITH DIFFERENTIAL/PLATELET  POC URINE PREG, ED  POCT PREGNANCY, URINE  CERVICOVAGINAL ANCILLARY ONLY    EKG   Radiology No results found.  Procedures Procedures (including critical care time)  Medications Ordered in UC Medications - No data to display  Initial Impression / Assessment and Plan / UC Course  I have reviewed the triage vital signs and the nursing notes.  Pertinent labs & imaging results that were available during my care of the patient were reviewed by me and considered in my medical decision making (see chart for details).   Urine pregnancy test was negative. CBC was completed. Megace was ordered. Patient hemodynamically stable at discharge. Advised patient to call OB/GYN for follow-up visit today. Further evaluation with ultrasound may be needed.  Final Clinical Impressions(s) / UC Diagnoses   Final diagnoses:  Vaginal bleeding  Screening for STDs (sexually transmitted diseases)     Discharge Instructions     Urine pregnancy was negative STd's and CBC was completed  Advised patient to follow-up with OB/GYN Resource was provided and patient was advised to call If symptoms worsening patient need to go to ED     ED Prescriptions    Medication Sig Dispense Auth. Provider   megestrol (MEGACE) 40 MG tablet Take 1 tablet (40 mg total) by mouth daily. 14 tablet Jonanthan Bolender, Darrelyn Hillock, FNP     PDMP not reviewed this encounter.   Emerson Monte, FNP 09/29/19 1247    Emerson Monte, FNP 09/29/19 1251

## 2019-09-29 NOTE — Discharge Instructions (Signed)
Urine pregnancy was negative STd's and CBC was completed  Advised patient to follow-up with OB/GYN Resource was provided and patient was advised to call If symptoms worsening patient need to go to ED

## 2019-09-29 NOTE — ED Triage Notes (Signed)
Pt states she woke up to excessive bleeding this morning. Pt states she was passing blood clots.

## 2019-09-30 LAB — RPR: RPR Ser Ql: NONREACTIVE

## 2019-10-01 ENCOUNTER — Telehealth: Payer: Self-pay | Admitting: Emergency Medicine

## 2019-10-01 LAB — CERVICOVAGINAL ANCILLARY ONLY
Bacterial vaginitis: NEGATIVE
Candida vaginitis: NEGATIVE
Chlamydia: NEGATIVE
Neisseria Gonorrhea: NEGATIVE
Trichomonas: POSITIVE — AB

## 2019-10-01 MED ORDER — METRONIDAZOLE 500 MG PO TABS: 2000.0000 mg | ORAL_TABLET | Freq: Once | ORAL | 0 refills | Status: AC

## 2019-10-01 NOTE — Telephone Encounter (Signed)
Trichomonas is positive. Rx  for Flagyl 2 grams, once was sent to the pharmacy of record. Pt needs education to refrain from sexual intercourse for 7 days to give the medicine time to work. Sexual partners need to be notified and tested/treated. Condoms may reduce risk of reinfection. Recheck for further evaluation if symptoms are not improving.   Patient contacted by phone and made aware of    results. Pt verbalized understanding and had all questions answered.    

## 2020-01-20 ENCOUNTER — Other Ambulatory Visit: Payer: Self-pay

## 2020-01-20 ENCOUNTER — Encounter (HOSPITAL_COMMUNITY): Payer: Self-pay

## 2020-01-20 ENCOUNTER — Ambulatory Visit (HOSPITAL_COMMUNITY)
Admission: EM | Admit: 2020-01-20 | Discharge: 2020-01-20 | Disposition: A | Discharge status: Home / Self Care | Payer: Medicaid Other | Attending: Family Medicine | Admitting: Family Medicine

## 2020-01-20 DIAGNOSIS — Z3202 Encounter for pregnancy test, result negative: Secondary | ICD-10-CM

## 2020-01-20 DIAGNOSIS — N898 Other specified noninflammatory disorders of vagina: Secondary | ICD-10-CM | POA: Diagnosis present

## 2020-01-20 LAB — POCT URINALYSIS DIP (DEVICE)
Bilirubin Urine: NEGATIVE
Glucose, UA: NEGATIVE mg/dL
Hgb urine dipstick: NEGATIVE
Ketones, ur: NEGATIVE mg/dL
Leukocytes,Ua: NEGATIVE
Nitrite: NEGATIVE
Protein, ur: NEGATIVE mg/dL
Specific Gravity, Urine: 1.02 (ref 1.005–1.030)
Urobilinogen, UA: 1 mg/dL (ref 0.0–1.0)
pH: 8.5 — ABNORMAL HIGH (ref 5.0–8.0)

## 2020-01-20 LAB — POC URINE PREG, ED: Preg Test, Ur: NEGATIVE

## 2020-01-20 LAB — POCT PREGNANCY, URINE: Preg Test, Ur: NEGATIVE

## 2020-01-20 NOTE — Discharge Instructions (Addendum)
We will send a swab for testing and call you with any positive reults.  Your pregnancy test was negative.

## 2020-01-20 NOTE — ED Triage Notes (Signed)
Pt is here with vaginal discharge that started yesterday. Pt wants STD testing, states she was last sexual active last week.

## 2020-01-21 LAB — CERVICOVAGINAL ANCILLARY ONLY
Bacterial Vaginitis (gardnerella): POSITIVE — AB
Candida Glabrata: NEGATIVE
Candida Vaginitis: POSITIVE — AB
Chlamydia: NEGATIVE
Comment: NEGATIVE
Comment: NEGATIVE
Comment: NEGATIVE
Comment: NEGATIVE
Comment: NEGATIVE
Comment: NORMAL
Neisseria Gonorrhea: NEGATIVE
Trichomonas: NEGATIVE

## 2020-01-21 NOTE — ED Provider Notes (Signed)
[facility]    CSN: [id number] Arrival date & time: 01/20/20  1331      History   Chief Complaint Chief Complaint  Patient presents with  . S74.5    HPI Carol Wheeler is a 24 y.o. female.   Patient is a 24 year old female past medical history of cerebral palsy.  She presents today with concerns for STD.  She has vaginal discharge that was abnormal that started yesterday.  Last sexual encounter was last week.  Symptoms have been constant.  She denies any vaginal itching or irritation.  No associate abdominal pain, back pain, fever, dysuria, hematuria or urinary frequency.  She would also like a pregnancy test.  ROS per HPI      Past Medical History:  Diagnosis Date  . Cerebral palsy ([place])   . Developmental delay     Patient Active Problem List   Diagnosis Date Noted  . Cerebral palsy ([place]) 06/29/2013    Past Surgical History:  Procedure Laterality Date  . LAPAROSCOPIC REVISION VENTRICULAR-PERITONEAL (V-P) SHUNT      OB History   No obstetric history on file.      Home Medications    Prior to Admission medications   Medication Sig Start Date End Date Taking? Authorizing Provider  acetaminophen (TYLENOL) 325 MG tablet Take by mouth. 03/23/17  Yes [provider]  ibuprofen (ADVIL) 200 MG tablet Take by mouth. 03/23/17  Yes [provider]  polyethylene glycol powder (GLYCOLAX/MIRALAX) 17 GM/SCOOP powder Take by mouth. 03/23/17  Yes [provider]  senna-docusate (SENOKOT-S) 8.6-50 MG tablet Take by mouth. 03/23/17  Yes [provider]  cyclobenzaprine (FLEXERIL) 5 MG tablet Take 1 tablet (5 mg total) by mouth 3 (three) times daily as needed for muscle spasms. 01/24/16   Junius Creamer, NP  Fluocinolone Acetonide Body 0.01 % OIL Apply topically.    [provider]  ibuprofen (ADVIL,MOTRIN) 600 MG tablet Take 1 tablet (600 mg total) by mouth every 6 (six) hours as needed. 01/24/16   Junius Creamer, NP  megestrol  (MEGACE) 40 MG tablet Take 1 tablet (40 mg total) by mouth daily. 09/29/19   Avegno, Darrelyn Hillock, FNP  nitrofurantoin, macrocrystal-monohydrate, (MACROBID) 100 MG capsule Take 1 capsule (100 mg total) by mouth 2 (two) times daily. 11/14/15   Little, Wenda Overland, MD    Family History Family History  Problem Relation Age of Onset  . Healthy Mother   . Healthy Father     Social History Social History   Tobacco Use  . Smoking status: Never Smoker  . Smokeless tobacco: Never Used  Substance Use Topics  . Alcohol use: No  . Drug use: No     Allergies   Patient has no known allergies.   Review of Systems Review of Systems   Physical Exam Triage Vital Signs ED Triage Vitals  Enc Vitals Group     BP 01/20/20 1358 124/65     Pulse Rate 01/20/20 1358 85     Resp 01/20/20 1358 18     Temp 01/20/20 1358 98.2 F (36.8 C)     Temp Source 01/20/20 1358 Oral     SpO2 01/20/20 1358 100 %     Weight 01/20/20 1355 144 lb (65.3 kg)     Height --      Head Circumference --      Peak Flow --      Pain Score 01/20/20 1355 0     Pain Loc --  Pain Edu? --      Excl. in GC? --    No data found.  Updated Vital Signs BP 124/65 (BP Location: Right Arm)   Pulse 85   Temp 98.2 F (36.8 C) (Oral)   Resp 18   Wt 144 lb (65.3 kg)   LMP 01/14/2020   SpO2 100%   BMI 27.21 kg/m   Visual Acuity Right Eye Distance:   Left Eye Distance:   Bilateral Distance:    Right Eye Near:   Left Eye Near:    Bilateral Near:     Physical Exam Vitals and nursing note reviewed.  Constitutional:      General: She is not in acute distress.    Appearance: Normal appearance. She is not ill-appearing, toxic-appearing or diaphoretic.  HENT:     Head: Normocephalic.     Nose: Nose normal.  Eyes:     Conjunctiva/sclera: Conjunctivae normal.  Pulmonary:     Effort: Pulmonary effort is normal.  Musculoskeletal:        General: Normal range of motion.     Cervical back: Normal range of  motion.  Skin:    General: Skin is warm and dry.     Findings: No rash.  Neurological:     Mental Status: She is alert.  Psychiatric:        Mood and Affect: Mood normal.      UC Treatments / Results  Labs (all labs ordered are listed, but only abnormal results are displayed) Labs Reviewed  POCT URINALYSIS DIP (DEVICE) - Abnormal; Notable for the following components:      Result Value   pH 8.5 (*)    All other components within normal limits  POC URINE PREG, ED  POCT PREGNANCY, URINE  CERVICOVAGINAL ANCILLARY ONLY    EKG   Radiology No results found.  Procedures Procedures (including critical care time)  Medications Ordered in UC Medications - No data to display  Initial Impression / Assessment and Plan / UC Course  I have reviewed the triage vital signs and the nursing notes.  Pertinent labs & imaging results that were available during my care of the patient were reviewed by me and considered in my medical decision making (see chart for details).     Vaginal discharge-swab sent for STD testing.  Deferring treatment until we get results. Pregnancy test negative Final Clinical Impressions(s) / UC Diagnoses   Final diagnoses:  Vaginal discharge     Discharge Instructions     We will send a swab for testing and call you with any positive reults.  Your pregnancy test was negative.      ED Prescriptions    None     PDMP not reviewed this encounter.   Dahlia Byes A, NP 01/21/20 1051

## 2020-01-22 ENCOUNTER — Telehealth (HOSPITAL_COMMUNITY): Payer: Self-pay

## 2020-01-22 MED ORDER — METRONIDAZOLE 500 MG PO TABS: 500.0000 mg | ORAL_TABLET | Freq: Two times a day (BID) | ORAL | 0 refills | Status: DC

## 2020-01-22 MED ORDER — FLUCONAZOLE 150 MG PO TABS: 150.0000 mg | ORAL_TABLET | Freq: Every day | ORAL | 0 refills | Status: AC

## 2020-03-21 ENCOUNTER — Encounter (HOSPITAL_COMMUNITY): Payer: Self-pay

## 2020-03-21 ENCOUNTER — Ambulatory Visit (HOSPITAL_COMMUNITY)
Admission: EM | Admit: 2020-03-21 | Discharge: 2020-03-21 | Disposition: A | Discharge status: Home / Self Care | Payer: Medicaid Other | Attending: Family Medicine | Admitting: Family Medicine

## 2020-03-21 ENCOUNTER — Other Ambulatory Visit: Payer: Self-pay

## 2020-03-21 DIAGNOSIS — Z113 Encounter for screening for infections with a predominantly sexual mode of transmission: Secondary | ICD-10-CM | POA: Insufficient documentation

## 2020-03-21 DIAGNOSIS — N898 Other specified noninflammatory disorders of vagina: Secondary | ICD-10-CM | POA: Diagnosis present

## 2020-03-21 LAB — HIV ANTIBODY (ROUTINE TESTING W REFLEX): HIV Screen 4th Generation wRfx: NONREACTIVE

## 2020-03-21 MED ORDER — METRONIDAZOLE 500 MG PO TABS
500.0000 mg | ORAL_TABLET | Freq: Two times a day (BID) | ORAL | 0 refills | Status: DC
Start: 2020-03-21 — End: 2020-07-08

## 2020-03-21 NOTE — Discharge Instructions (Addendum)
Your STD tests are pending.  If your test results are positive, we will call you.  You may need additional treatment and your partner(s) may also need treatment.      Get Boric Acid capsules and insert vaginally for 30 days. Then use one capsule weekly to maintain pH balance.  Follow up with this office or primary care as needed.

## 2020-03-21 NOTE — ED Triage Notes (Signed)
Pt presents with brown vaginal discharge that she noticed today.

## 2020-03-22 ENCOUNTER — Ambulatory Visit (HOSPITAL_COMMUNITY)
Admission: EM | Admit: 2020-03-22 | Discharge: 2020-03-22 | Disposition: A | Discharge status: Home / Self Care | Payer: Medicaid Other | Attending: Family Medicine | Admitting: Family Medicine

## 2020-03-22 ENCOUNTER — Telehealth (HOSPITAL_COMMUNITY): Payer: Self-pay | Admitting: Orthopedic Surgery

## 2020-03-22 ENCOUNTER — Other Ambulatory Visit: Payer: Self-pay

## 2020-03-22 DIAGNOSIS — B9689 Other specified bacterial agents as the cause of diseases classified elsewhere: Secondary | ICD-10-CM | POA: Diagnosis not present

## 2020-03-22 DIAGNOSIS — N760 Acute vaginitis: Secondary | ICD-10-CM | POA: Diagnosis not present

## 2020-03-22 DIAGNOSIS — Z113 Encounter for screening for infections with a predominantly sexual mode of transmission: Secondary | ICD-10-CM | POA: Diagnosis present

## 2020-03-22 LAB — RPR: RPR Ser Ql: NONREACTIVE

## 2020-03-22 NOTE — ED Notes (Signed)
Patient returned to UC to re-collect cervicovaginal swab. Order placed and swab collected.

## 2020-03-22 NOTE — ED Provider Notes (Signed)
[facility]   [id number] 03/21/20 Arrival Time: 1914   CC: VAGINAL DISCHARGE  SUBJECTIVE:  Carol Wheeler is a 24 y.o. female who presents with complaints of gradual brown vaginal discharge that began today. She denies a precipitating event, recent sexual encounter or recent antibiotic use.  Patient is sexually active with 1 female partner. There are no aggravating or alleviating factors. She denies fever, chills, nausea, vomiting, abdominal or pelvic pain, urinary symptoms, vaginal itching, vaginal odor, vaginal bleeding, dyspareunia, vaginal rashes or lesions.   Patient's last menstrual period was 03/11/2020. Current birth control method: none Compliant with BC:  ROS: As per HPI.  All other pertinent ROS negative.     Past Medical History:  Diagnosis Date  . Cerebral palsy ([place])   . Developmental delay    Past Surgical History:  Procedure Laterality Date  . LAPAROSCOPIC REVISION VENTRICULAR-PERITONEAL (V-P) SHUNT     No Known Allergies No current facility-administered medications on file prior to encounter.   Current Outpatient Medications on File Prior to Encounter  Medication Sig Dispense Refill  . acetaminophen (TYLENOL) 325 MG tablet Take by mouth.    . cyclobenzaprine (FLEXERIL) 5 MG tablet Take 1 tablet (5 mg total) by mouth 3 (three) times daily as needed for muscle spasms. 30 tablet 0  . Fluocinolone Acetonide Body 0.01 % OIL Apply topically.    Marland Kitchen ibuprofen (ADVIL) 200 MG tablet Take by mouth.    Marland Kitchen ibuprofen (ADVIL,MOTRIN) 600 MG tablet Take 1 tablet (600 mg total) by mouth every 6 (six) hours as needed. 30 tablet 0  . megestrol (MEGACE) 40 MG tablet Take 1 tablet (40 mg total) by mouth daily. 14 tablet 0  . polyethylene glycol powder (GLYCOLAX/MIRALAX) 17 GM/SCOOP powder Take by mouth.    . senna-docusate (SENOKOT-S) 8.6-50 MG tablet Take by mouth.      Social History   Socioeconomic History  . Marital status: Single    Spouse name: Not on file  .  Number of children: 0  . Years of education: 47  . Highest education level: Not on file  Occupational History  . Not on file  Tobacco Use  . Smoking status: Never Smoker  . Smokeless tobacco: Never Used  Substance and Sexual Activity  . Alcohol use: No  . Drug use: No  . Sexual activity: Yes    Birth control/protection: None  Other Topics Concern  . Not on file  Social History Narrative  . Not on file   Social Determinants of Health   Financial Resource Strain:   . Difficulty of Paying Living Expenses:   Food Insecurity:   . Worried About Charity fundraiser in the Last Year:   . Arboriculturist in the Last Year:   Transportation Needs:   . Film/video editor (Medical):   Marland Kitchen Lack of Transportation (Non-Medical):   Physical Activity:   . Days of Exercise per Week:   . Minutes of Exercise per Session:   Stress:   . Feeling of Stress :   Social Connections:   . Frequency of Communication with Friends and Family:   . Frequency of Social Gatherings with Friends and Family:   . Attends Religious Services:   . Active Member of Clubs or Organizations:   . Attends Archivist Meetings:   Marland Kitchen Marital Status:   Intimate Partner Violence:   . Fear of Current or Ex-Partner:   . Emotionally Abused:   Marland Kitchen Physically Abused:   . Sexually  Abused:    Family History  Problem Relation Age of Onset  . Healthy Mother   . Healthy Father     OBJECTIVE:  Vitals:   03/21/20 1622  BP: 130/77  Pulse: 71  Resp: 18  Temp: 98.3 F (36.8 C)  TempSrc: Oral  SpO2: 99%     General appearance: Alert, NAD, appears stated age Head: NCAT Throat: lips, mucosa, and tongue normal; teeth and gums normal Lungs: CTA bilaterally without adventitious breath sounds Heart: regular rate and rhythm.  Radial pulses 2+ symmetrical bilaterally Back: no CVA tenderness Abdomen: soft, non-tender; bowel sounds normal; no masses or organomegaly; no guarding or rebound tenderness GU: declines    Skin: warm and dry Psychological:  Alert and cooperative. Normal mood and affect.  LABS:  Results for orders placed or performed during the hospital encounter of 03/21/20  RPR  Result Value Ref Range   RPR Ser Ql NON REACTIVE NON REACTIVE  HIV Antibody (routine testing w rflx)  Result Value Ref Range   HIV Screen 4th Generation wRfx Non Reactive Non Reactive    Labs Reviewed  RPR  HIV ANTIBODY (ROUTINE TESTING W REFLEX)  CERVICOVAGINAL ANCILLARY ONLY    ASSESSMENT & PLAN:  1. Vaginal discharge   2. Screen for STD (sexually transmitted disease)     Meds ordered this encounter  Medications  . metroNIDAZOLE (FLAGYL) 500 MG tablet    Sig: Take 1 tablet (500 mg total) by mouth 2 (two) times daily.    Dispense:  14 tablet    Refill:  0    Order Specific Question:   Supervising Provider    Answer:   Merrilee Jansky [id number]    Pending: Labs Reviewed  RPR  HIV ANTIBODY (ROUTINE TESTING W REFLEX)  CERVICOVAGINAL ANCILLARY ONLY     Vaginal self-swab obtained.  We will follow up with you regarding abnormal results HIV/ syphilis testing today Prescribed metronidazole 500 mg twice daily for 7 days (do not take while consuming alcohol and/or if breastfeeding) for hx BV with similar discharge today Take medications as prescribed and to completion If tests results are positive, please abstain from sexual activity until you and your partner(s) have been treated Follow up with PCP or [facility] if symptoms persists Return here or go to ER if you have any new or worsening symptoms fever, chills, nausea, vomiting, abdominal or pelvic pain, painful intercourse, vaginal discharge, vaginal bleeding, persistent symptoms despite treatment Reviewed expectations re: course of current medical issues. Questions answered. Outlined signs and symptoms indicating need for more acute intervention. Patient verbalized understanding. After Visit Summary given.        Moshe Cipro, NP 03/22/20 1417

## 2020-03-22 NOTE — Telephone Encounter (Signed)
Notified by nursing that pt needs to return to repeat cervicovaginal swab. Lab unable to take specimen.   Pt verbalized understanding and states she will return to clinic.

## 2020-03-23 LAB — CERVICOVAGINAL ANCILLARY ONLY
Bacterial Vaginitis (gardnerella): POSITIVE — AB
Candida Glabrata: NEGATIVE
Candida Vaginitis: NEGATIVE
Chlamydia: NEGATIVE
Comment: NEGATIVE
Comment: NEGATIVE
Comment: NEGATIVE
Comment: NEGATIVE
Comment: NEGATIVE
Comment: NORMAL
Neisseria Gonorrhea: NEGATIVE
Trichomonas: NEGATIVE

## 2020-07-06 ENCOUNTER — Encounter (HOSPITAL_COMMUNITY): Payer: Self-pay

## 2020-07-06 ENCOUNTER — Ambulatory Visit (HOSPITAL_COMMUNITY)
Admission: EM | Admit: 2020-07-06 | Discharge: 2020-07-06 | Disposition: A | Discharge status: Home / Self Care | Payer: Medicaid Other | Attending: Family Medicine | Admitting: Family Medicine

## 2020-07-06 ENCOUNTER — Other Ambulatory Visit: Payer: Self-pay

## 2020-07-06 DIAGNOSIS — Z711 Person with feared health complaint in whom no diagnosis is made: Secondary | ICD-10-CM | POA: Diagnosis not present

## 2020-07-06 DIAGNOSIS — R1030 Lower abdominal pain, unspecified: Secondary | ICD-10-CM | POA: Diagnosis present

## 2020-07-06 LAB — POCT URINALYSIS DIPSTICK, ED / UC
Bilirubin Urine: NEGATIVE
Glucose, UA: NEGATIVE mg/dL
Hgb urine dipstick: NEGATIVE
Ketones, ur: NEGATIVE mg/dL
Leukocytes,Ua: NEGATIVE
Nitrite: NEGATIVE
Protein, ur: NEGATIVE mg/dL
Specific Gravity, Urine: 1.015 (ref 1.005–1.030)
Urobilinogen, UA: 0.2 mg/dL (ref 0.0–1.0)
pH: 5.5 (ref 5.0–8.0)

## 2020-07-06 LAB — HIV ANTIBODY (ROUTINE TESTING W REFLEX): HIV Screen 4th Generation wRfx: NONREACTIVE

## 2020-07-06 NOTE — ED Triage Notes (Signed)
Pt presents with lower abdominal pain X 2 days: Pt also requesting full STD Testing.

## 2020-07-06 NOTE — ED Provider Notes (Signed)
[facility]    CSN: [id number] Arrival date & time: 07/06/20  1927      History   Chief Complaint Chief Complaint  Patient presents with  . Abdominal Pain  . STD Testing    HPI Carol Wheeler is a 24 y.o. female.   Patient presenting today with 2 days of b/l lower abdominal pain that feels crampy and dull. Nothing seems to make it better or worse. Denies vaginal discharge, rashes, exposures to STIs, back pain, N/V/D, fevers. Has not tried anything for sxs. Requesting full STI panel today for screening.      Past Medical History:  Diagnosis Date  . Cerebral palsy (HCC)   . Developmental delay     Patient Active Problem List   Diagnosis Date Noted  . Cerebral palsy (HCC) 06/29/2013    Past Surgical History:  Procedure Laterality Date  . LAPAROSCOPIC REVISION VENTRICULAR-PERITONEAL (V-P) SHUNT      OB History   No obstetric history on file.      Home Medications    Prior to Admission medications   Medication Sig Start Date End Date Taking? Authorizing Provider  acetaminophen (TYLENOL) 325 MG tablet Take by mouth. 03/23/17   [provider]  cyclobenzaprine (FLEXERIL) 5 MG tablet Take 1 tablet (5 mg total) by mouth 3 (three) times daily as needed for muscle spasms. 01/24/16   Earley Favor, NP  Fluocinolone Acetonide Body 0.01 % OIL Apply topically.    [provider]  ibuprofen (ADVIL) 200 MG tablet Take by mouth. 03/23/17   [provider]  ibuprofen (ADVIL,MOTRIN) 600 MG tablet Take 1 tablet (600 mg total) by mouth every 6 (six) hours as needed. 01/24/16   Earley Favor, NP  megestrol (MEGACE) 40 MG tablet Take 1 tablet (40 mg total) by mouth daily. 09/29/19   Avegno, Zachery Dakins, FNP  metroNIDAZOLE (FLAGYL) 500 MG tablet Take 1 tablet (500 mg total) by mouth 2 (two) times daily. 03/21/20   Moshe Cipro, NP  polyethylene glycol powder (GLYCOLAX/MIRALAX) 17 GM/SCOOP powder Take by mouth. 03/23/17   [provider]    senna-docusate (SENOKOT-S) 8.6-50 MG tablet Take by mouth. 03/23/17   [provider]    Family History Family History  Problem Relation Age of Onset  . Healthy Mother   . Healthy Father     Social History Social History   Tobacco Use  . Smoking status: Never Smoker  . Smokeless tobacco: Never Used  Substance Use Topics  . Alcohol use: No  . Drug use: No     Allergies   Patient has no known allergies.   Review of Systems Review of Systems PER HPI    Physical Exam Triage Vital Signs ED Triage Vitals  Enc Vitals Group     BP 07/06/20 2037 116/83     Pulse Rate 07/06/20 2037 80     Resp 07/06/20 2037 18     Temp 07/06/20 2037 98.4 F (36.9 C)     Temp Source 07/06/20 2037 Oral     SpO2 07/06/20 2037 100 %     Weight --      Height --      Head Circumference --      Peak Flow --      Pain Score 07/06/20 2038 6     Pain Loc --      Pain Edu? --      Excl. in GC? --    No data found.  Updated Vital Signs  BP 116/83 (BP Location: Left Arm)   Pulse 80   Temp 98.4 F (36.9 C) (Oral)   Resp 18   LMP 07/02/2020   SpO2 100%   Visual Acuity Right Eye Distance:   Left Eye Distance:   Bilateral Distance:    Right Eye Near:   Left Eye Near:    Bilateral Near:     Physical Exam Vitals and nursing note reviewed.  Constitutional:      Appearance: Normal appearance. She is not ill-appearing.  HENT:     Head: Atraumatic.  Eyes:     Extraocular Movements: Extraocular movements intact.     Conjunctiva/sclera: Conjunctivae normal.  Cardiovascular:     Rate and Rhythm: Normal rate and regular rhythm.     Heart sounds: Normal heart sounds.  Pulmonary:     Effort: Pulmonary effort is normal.     Breath sounds: Normal breath sounds.  Abdominal:     General: Bowel sounds are normal. There is no distension.     Palpations: Abdomen is soft.     Tenderness: There is no abdominal tenderness. There is no right CVA tenderness, left CVA tenderness or  guarding.  Musculoskeletal:        General: Normal range of motion.     Cervical back: Normal range of motion and neck supple.  Skin:    General: Skin is warm and dry.  Neurological:     Mental Status: She is alert and oriented to person, place, and time.  Psychiatric:        Mood and Affect: Mood normal.        Thought Content: Thought content normal.        Judgment: Judgment normal.     UC Treatments / Results  Labs (all labs ordered are listed, but only abnormal results are displayed) Labs Reviewed  HIV ANTIBODY (ROUTINE TESTING W REFLEX)  RPR  POCT URINALYSIS DIPSTICK, ED / UC  CERVICOVAGINAL ANCILLARY ONLY    EKG   Radiology No results found.  Procedures Procedures (including critical care time)  Medications Ordered in UC Medications - No data to display  Initial Impression / Assessment and Plan / UC Course  I have reviewed the triage vital signs and the nursing notes.  Pertinent labs & imaging results that were available during my care of the patient were reviewed by me and considered in my medical decision making (see chart for details).  Clinical Course as of Jul 06 2110  Wed Jul 06, 2020  2046 Cervicovaginal ancillary only [BH]  2046 HIV antibody [BH]    Clinical Course User Index [BH] Mardella Layman, MD   Abdominal Pain  U/A benign today, vitals and exam reassuring, tolerating PO well. aptima swab and STI labs pending. WIll treat based on these results. Discussed BRAT diet, probiotics, push fluids. F/u if worsening or not resolving.   Final Clinical Impressions(s) / UC Diagnoses   Final diagnoses:  Lower abdominal pain   Discharge Instructions   None    ED Prescriptions    None     PDMP not reviewed this encounter.   Particia Nearing, [place] 07/06/20 2111

## 2020-07-07 LAB — RPR: RPR Ser Ql: NONREACTIVE

## 2020-07-08 ENCOUNTER — Telehealth (HOSPITAL_COMMUNITY): Payer: Self-pay | Admitting: Emergency Medicine

## 2020-07-08 LAB — CERVICOVAGINAL ANCILLARY ONLY
Bacterial Vaginitis (gardnerella): POSITIVE — AB
Candida Glabrata: NEGATIVE
Candida Vaginitis: NEGATIVE
Chlamydia: NEGATIVE
Comment: NEGATIVE
Comment: NEGATIVE
Comment: NEGATIVE
Comment: NEGATIVE
Comment: NEGATIVE
Comment: NORMAL
Neisseria Gonorrhea: NEGATIVE
Trichomonas: NEGATIVE

## 2020-07-08 MED ORDER — METRONIDAZOLE 500 MG PO TABS: 500.0000 mg | ORAL_TABLET | Freq: Two times a day (BID) | ORAL | 0 refills | Status: DC

## 2021-05-27 ENCOUNTER — Other Ambulatory Visit: Payer: Self-pay

## 2021-05-27 ENCOUNTER — Ambulatory Visit
Admission: EM | Admit: 2021-05-27 | Discharge: 2021-05-27 | Disposition: A | Discharge status: Home / Self Care | Payer: Medicaid Other | Attending: Internal Medicine | Admitting: Internal Medicine

## 2021-05-27 ENCOUNTER — Encounter: Payer: Self-pay | Admitting: Emergency Medicine

## 2021-05-27 DIAGNOSIS — N912 Amenorrhea, unspecified: Secondary | ICD-10-CM | POA: Diagnosis not present

## 2021-05-27 LAB — POCT URINE PREGNANCY: Preg Test, Ur: NEGATIVE

## 2021-05-27 NOTE — Discharge Instructions (Addendum)
Urine pregnancy test was negative in urgent care today.  Blood work pending for pregnancy.  We will call if this is positive.  Please follow-up with your gynecologist as soon as possible for further evaluation and management.

## 2021-05-27 NOTE — ED Triage Notes (Signed)
Patient states that she has not had her period in over 4 months.  Patient has taken home pregnancy tests a couple of months back which were negative.  Patient has been feeling nauseous.

## 2021-05-27 NOTE — ED Provider Notes (Signed)
[facility]    CSN: [id number] Arrival date & time: 05/27/21  1302      History   Chief Complaint Chief Complaint  Patient presents with   Amenorrhea    HPI Carol Wheeler is a 25 y.o. female.   Patient presents with 4 month history of amenorrhea and nausea and is requesting pregnancy test.  Patient denies any pelvic pain, abdominal pain, fever, back pain, dysuria, urinary frequency, vaginal discharge.  Denies any known exposure to STD.  States that she does not have regular periods, and her periods are approximately every 2 months on average.  Denies any diagnosis of endometriosis or PCOS.  Last visit with gynecologist was in March 2021.  Patient is also not currently on any form of birth control.    Past Medical History:  Diagnosis Date   Cerebral palsy [facility])    Developmental delay     Patient Active Problem List   Diagnosis Date Noted   Cerebral palsy (HCC) 06/29/2013    Past Surgical History:  Procedure Laterality Date   LAPAROSCOPIC REVISION VENTRICULAR-PERITONEAL (V-P) SHUNT      OB History   No obstetric history on file.      Home Medications    Prior to Admission medications   Medication Sig Start Date End Date Taking? Authorizing Provider  acetaminophen (TYLENOL) 325 MG tablet Take by mouth. 03/23/17   [provider]  cyclobenzaprine (FLEXERIL) 5 MG tablet Take 1 tablet (5 mg total) by mouth 3 (three) times daily as needed for muscle spasms. 01/24/16   Earley Favor, NP  Fluocinolone Acetonide Body 0.01 % OIL Apply topically.    [provider]  ibuprofen (ADVIL) 200 MG tablet Take by mouth. 03/23/17   [provider]  ibuprofen (ADVIL,MOTRIN) 600 MG tablet Take 1 tablet (600 mg total) by mouth every 6 (six) hours as needed. 01/24/16   Earley Favor, NP  megestrol (MEGACE) 40 MG tablet Take 1 tablet (40 mg total) by mouth daily. 09/29/19   Avegno, Zachery Dakins, FNP  metroNIDAZOLE (FLAGYL) 500 MG tablet Take 1 tablet (500  mg total) by mouth 2 (two) times daily. 07/08/20   Lamptey, Britta Mccreedy, MD  polyethylene glycol powder (GLYCOLAX/MIRALAX) 17 GM/SCOOP powder Take by mouth. 03/23/17   [provider]  senna-docusate (SENOKOT-S) 8.6-50 MG tablet Take by mouth. 03/23/17   [provider]    Family History Family History  Problem Relation Age of Onset   Healthy Mother    Healthy Father     Social History Social History   Tobacco Use   Smoking status: Never   Smokeless tobacco: Never  Substance Use Topics   Alcohol use: No   Drug use: No     Allergies   Patient has no known allergies.   Review of Systems Review of Systems Per HPI  Physical Exam Triage Vital Signs ED Triage Vitals  Enc Vitals Group     BP 05/27/21 1430 132/82     Pulse Rate 05/27/21 1430 62     Resp 05/27/21 1430 16     Temp 05/27/21 1430 97.7 F (36.5 C)     Temp Source 05/27/21 1430 Oral     SpO2 05/27/21 1430 96 %     Weight 05/27/21 1432 184 lb (83.5 kg)     Height 05/27/21 1432 5\' 1"  (1.549 m)     Head Circumference --      Peak Flow --      Pain Score 05/27/21 1432  0     Pain Loc --      Pain Edu? --      Excl. in GC? --    No data found.  Updated Vital Signs BP 132/82 (BP Location: Left Arm)   Pulse 62   Temp 97.7 F (36.5 C) (Oral)   Resp 16   Ht 5\' 1"  (1.549 m)   Wt 184 lb (83.5 kg)   SpO2 96%   BMI 34.77 kg/m   Visual Acuity Right Eye Distance:   Left Eye Distance:   Bilateral Distance:    Right Eye Near:   Left Eye Near:    Bilateral Near:     Physical Exam Constitutional:      Appearance: Normal appearance.  HENT:     Head: Normocephalic and atraumatic.  Eyes:     Extraocular Movements: Extraocular movements intact.     Conjunctiva/sclera: Conjunctivae normal.  Cardiovascular:     Rate and Rhythm: Normal rate and regular rhythm.     Pulses: Normal pulses.     Heart sounds: Normal heart sounds.  Pulmonary:     Effort: Pulmonary effort is normal.     Breath  sounds: Normal breath sounds.  Abdominal:     General: Abdomen is flat. Bowel sounds are normal. There is no distension.     Palpations: Abdomen is soft.     Tenderness: There is no abdominal tenderness.  Neurological:     General: No focal deficit present.     Mental Status: She is alert and oriented to person, place, and time. Mental status is at baseline.  Psychiatric:        Mood and Affect: Mood normal.        Behavior: Behavior normal.        Thought Content: Thought content normal.        Judgment: Judgment normal.     UC Treatments / Results  Labs (all labs ordered are listed, but only abnormal results are displayed) Labs Reviewed  HCG, QUANTITATIVE, PREGNANCY  POCT URINE PREGNANCY    EKG   Radiology No results found.  Procedures Procedures (including critical care time)  Medications Ordered in UC Medications - No data to display  Initial Impression / Assessment and Plan / UC Course  I have reviewed the triage vital signs and the nursing notes.  Pertinent labs & imaging results that were available during my care of the patient were reviewed by me and considered in my medical decision making (see chart for details).     Urine pregnancy test was negative in urgent care today.  Will send hCG quantitative pregnancy test to confirm.  Suspect that amenorrhea is a result of possible chronic conditions such as PCOS or endometriosis.  Advised patient to go to gynecologist as soon as possible for further evaluation and management.  Low suspicion for urinary tract infection or STD as patient does not have any symptoms that coincide with these diagnoses.Discussed strict return precautions. Patient verbalized understanding and is agreeable with plan.  Final Clinical Impressions(s) / UC Diagnoses   Final diagnoses:  Amenorrhea     Discharge Instructions      Urine pregnancy test was negative in urgent care today.  Blood work pending for pregnancy.  We will call if this  is positive.  Please follow-up with your gynecologist as soon as possible for further evaluation and management.     ED Prescriptions   None    PDMP not reviewed this encounter.   ,  Acie Fredrickson, FNP 05/27/21 1501

## 2021-06-01 LAB — SPECIMEN STATUS REPORT

## 2021-06-01 LAB — BETA HCG QUANT (REF LAB): hCG Quant: <1 m[IU]/mL

## 2022-01-04 ENCOUNTER — Encounter: Payer: Self-pay | Admitting: Family Medicine

## 2022-01-04 ENCOUNTER — Encounter: Payer: Medicaid Other | Admitting: Family Medicine

## 2022-01-04 NOTE — Progress Notes (Signed)
Patient did not keep appointment today. She may call to reschedule.  

## 2022-02-02 ENCOUNTER — Ambulatory Visit (INDEPENDENT_AMBULATORY_CARE_PROVIDER_SITE_OTHER): Payer: Medicaid Other

## 2022-02-02 ENCOUNTER — Ambulatory Visit
Admission: EM | Admit: 2022-02-02 | Discharge: 2022-02-02 | Disposition: A | Discharge status: Home / Self Care | Payer: Medicaid Other | Attending: Physician Assistant | Admitting: Physician Assistant

## 2022-02-02 DIAGNOSIS — R079 Chest pain, unspecified: Secondary | ICD-10-CM | POA: Diagnosis not present

## 2022-02-02 DIAGNOSIS — R0789 Other chest pain: Secondary | ICD-10-CM

## 2022-02-02 LAB — POCT URINE PREGNANCY: Preg Test, Ur: NEGATIVE

## 2022-02-02 MED ORDER — NAPROXEN 375 MG PO TABS: 375.0000 mg | ORAL_TABLET | Freq: Two times a day (BID) | ORAL | 0 refills | Status: DC

## 2022-02-02 NOTE — Discharge Instructions (Signed)
Your x-ray and EKG were normal.  As we discussed, I think that your symptoms are musculoskeletal in nature.  Please take Naprosyn twice daily.  You should not take NSAIDs including aspirin, ibuprofen/Advil, naproxen/Aleve with this medication as it can cause stomach bleeding.  You can use Tylenol for breakthrough pain.  Use heat and gentle stretch in this area.  If you have any worsening symptoms including changing or more persistent chest pain, shortness of breath, nausea, vomiting, weakness, passing out you must go to the emergency room immediately.  Please follow-up with your PCP first thing next week for recheck. ?

## 2022-02-02 NOTE — ED Triage Notes (Signed)
Pt c/o chest pains "on the left side where my heart is at and it just sits there." Tried tylenol without relief. Onset ~ "last week."  ?

## 2022-02-02 NOTE — ED Provider Notes (Signed)
?[facility] ? ? ? ?CSN: [id number] ?Arrival date & time: 02/02/22  1159 ? ? ?  ? ?History   ?Chief Complaint ?Chief Complaint  ?Patient presents with  ? Chest Pain  ? ? ?HPI ?Carol Wheeler is a 26 y.o. female.  ? ?Patient presents today with a 2-day history of intermittent chest pain.  She reports pain is rated 7 on a 0-10 pain scale, localized to left chest, described as sharp, no aggravating relieving factors significant.  She has tried over-the-counter analgesics without improvement of symptoms.  She denies any recent illness or additional symptoms including cough, fever, nausea, vomiting, chest pain, shortness of breath.  She denies history of cardiovascular disease, hypertension, hyper lipidemia, diabetes, smoking.  She does have a family history in second-degree relatives of cardiovascular disease at advanced age.  She denies any injury or increase in activity prior to symptom onset.  She denies any association with activity. ? ? ?Past Medical History:  ?Diagnosis Date  ? Cerebral palsy ([place])   ? Developmental delay   ? ? ?Patient Active Problem List  ? Diagnosis Date Noted  ? Cerebral palsy ([place]) 06/29/2013  ? ? ?Past Surgical History:  ?Procedure Laterality Date  ? LAPAROSCOPIC REVISION VENTRICULAR-PERITONEAL (V-P) SHUNT    ? ? ?OB History   ?No obstetric history on file. ?  ? ? ? ?Home Medications   ? ?Prior to Admission medications   ?Medication Sig Start Date End Date Taking? Authorizing Provider  ?naproxen (NAPROSYN) 375 MG tablet Take 1 tablet (375 mg total) by mouth 2 (two) times daily. 02/02/22  Yes Eugen Jeansonne K, PA-C  ?acetaminophen (TYLENOL) 325 MG tablet Take by mouth. 03/23/17   [provider]  ? ? ?Family History ?Family History  ?Problem Relation Age of Onset  ? Healthy Mother   ? Healthy Father   ? ? ?Social History ?Social History  ? ?Tobacco Use  ? Smoking status: Never  ? Smokeless tobacco: Never  ?Substance Use Topics  ? Alcohol use: No  ? Drug use: No   ? ? ? ?Allergies   ?Patient has no known allergies. ? ? ?Review of Systems ?Review of Systems  ?Constitutional:  Positive for activity change. Negative for appetite change, diaphoresis, fatigue and fever.  ?Respiratory:  Negative for cough and shortness of breath.   ?Cardiovascular:  Positive for chest pain. Negative for palpitations.  ?Gastrointestinal:  Negative for abdominal pain, diarrhea, nausea and vomiting.  ?Neurological:  Negative for dizziness, light-headedness and headaches.  ? ? ?Physical Exam ?Triage Vital Signs ?ED Triage Vitals [02/02/22 1211]  ?Enc Vitals Group  ?   BP (!) 133/93  ?   Pulse Rate 72  ?   Resp 18  ?   Temp 98 ?F (36.7 ?C)  ?   Temp Source Oral  ?   SpO2 99 %  ?   Weight   ?   Height   ?   Head Circumference   ?   Peak Flow   ?   Pain Score 0  ?   Pain Loc   ?   Pain Edu?   ?   Excl. in [place]?   ? ?No data found. ? ?Updated Vital Signs ?BP (!) 133/93 (BP Location: Left Arm)   Pulse 72   Temp 98 ?F (36.7 ?C) (Oral)   Resp 18   SpO2 99%  ? ?Visual Acuity ?Right Eye Distance:   ?Left Eye Distance:   ?Bilateral Distance:   ? ?Right Eye  Near:   ?Left Eye Near:    ?Bilateral Near:    ? ?Physical Exam ?Vitals reviewed.  ?Constitutional:   ?   General: She is awake. She is not in acute distress. ?   Appearance: Normal appearance. She is well-developed. She is not ill-appearing.  ?   Comments: Very pleasant female appears stated age in no acute distress sitting comfortably in exam room  ?HENT:  ?   Head: Normocephalic and atraumatic.  ?   Mouth/Throat:  ?   Pharynx: Uvula midline. No oropharyngeal exudate or posterior oropharyngeal erythema.  ?Cardiovascular:  ?   Rate and Rhythm: Normal rate and regular rhythm.  ?   Heart sounds: Normal heart sounds, S1 normal and S2 normal. No murmur heard. ?Pulmonary:  ?   Effort: Pulmonary effort is normal.  ?   Breath sounds: Normal breath sounds. No wheezing, rhonchi or rales.  ?   Comments: Clear to auscultation bilaterally ?Chest:  ?   Chest wall:  Tenderness present. No deformity or swelling.  ?   Comments: Pain reproducible on exam ?Psychiatric:     ?   Behavior: Behavior is cooperative.  ? ? ? ?UC Treatments / Results  ?Labs ?(all labs ordered are listed, but only abnormal results are displayed) ?Labs Reviewed  ?POCT URINE PREGNANCY  ? ? ?EKG ? ? ?Radiology ?DG Chest 2 View ? ?Result Date: 02/02/2022 ?CLINICAL DATA:  Chest pain EXAM: CHEST - 2 VIEW COMPARISON:  None. FINDINGS: Normal cardiac and mediastinal contours. No focal pulmonary opacity. No pleural effusion or pneumothorax. No acute osseous abnormality. 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 Additional abandoned shunt catheter is also noted. IMPRESSION: No acute cardiopulmonary process. Electronically Signed   By: Merilyn Baba M.D.   On: 02/02/2022 13:16   ? ?Procedures ?Procedures (including critical care time) ? ?Medications Ordered in UC ?Medications - No data to display ? ?Initial Impression / Assessment and Plan / UC Course  ?I have reviewed the triage vital signs and the nursing notes. ? ?Pertinent labs & imaging results that were available during my care of the patient were reviewed by me and considered in my medical decision making (see chart for details). ? ?  ? ?EKG obtained showed normal sinus rhythm with ventricular rate of 80 bpm without ischemic changes; no previous to compare.  Chest x-ray was obtained that showed no acute cardiopulmonary disease.  Pain is reproducible on exam.  Discussed likely musculoskeletal etiology.  Patient was started on Naprosyn twice daily.  Discussed that she should not take NSAIDs with this medication due to risk of GI bleeding.  Can use Tylenol for breakthrough pain.  Recommended heat and gentle stretch for additional symptom relief.  Discussed that she should follow-up with her PCP first thing next week for reevaluation.  If any point anything worsens and she develops changing or different chest pain, shortness of  breath, lightheadedness, near syncope/syncope she needs to go to the emergency room immediately.  Strict return precautions given.  Work excuse note provided. ? ?Final Clinical Impressions(s) / UC Diagnoses  ? ?Final diagnoses:  ?Chest wall pain  ? ? ? ?Discharge Instructions   ? ?  ?Your x-ray and EKG were normal.  As we discussed, I think that your symptoms are musculoskeletal in nature.  Please take Naprosyn twice daily.  You should not take NSAIDs including aspirin, ibuprofen/Advil, naproxen/Aleve with this medication as it can cause stomach bleeding.  You can use  Tylenol for breakthrough pain.  Use heat and gentle stretch in this area.  If you have any worsening symptoms including changing or more persistent chest pain, shortness of breath, nausea, vomiting, weakness, passing out you must go to the emergency room immediately.  Please follow-up with your PCP first thing next week for recheck. ? ? ? ? ?ED Prescriptions   ? ? Medication Sig Dispense Auth. Provider  ? naproxen (NAPROSYN) 375 MG tablet Take 1 tablet (375 mg total) by mouth 2 (two) times daily. 20 tablet Zarrah Loveland K, PA-C  ? ?  ? ?PDMP not reviewed this encounter. ?  ?Terrilee Croak, PA-C ?02/02/22 1342 ? ?

## 2022-05-11 LAB — HM PAP SMEAR

## 2022-07-19 IMAGING — DX DG CHEST 2V
2 series · 2 of 2 positions shown · non-contrast
Comparison: None.

CLINICAL DATA: Chest pain

EXAM:
CHEST - 2 VIEW

[chest pa]
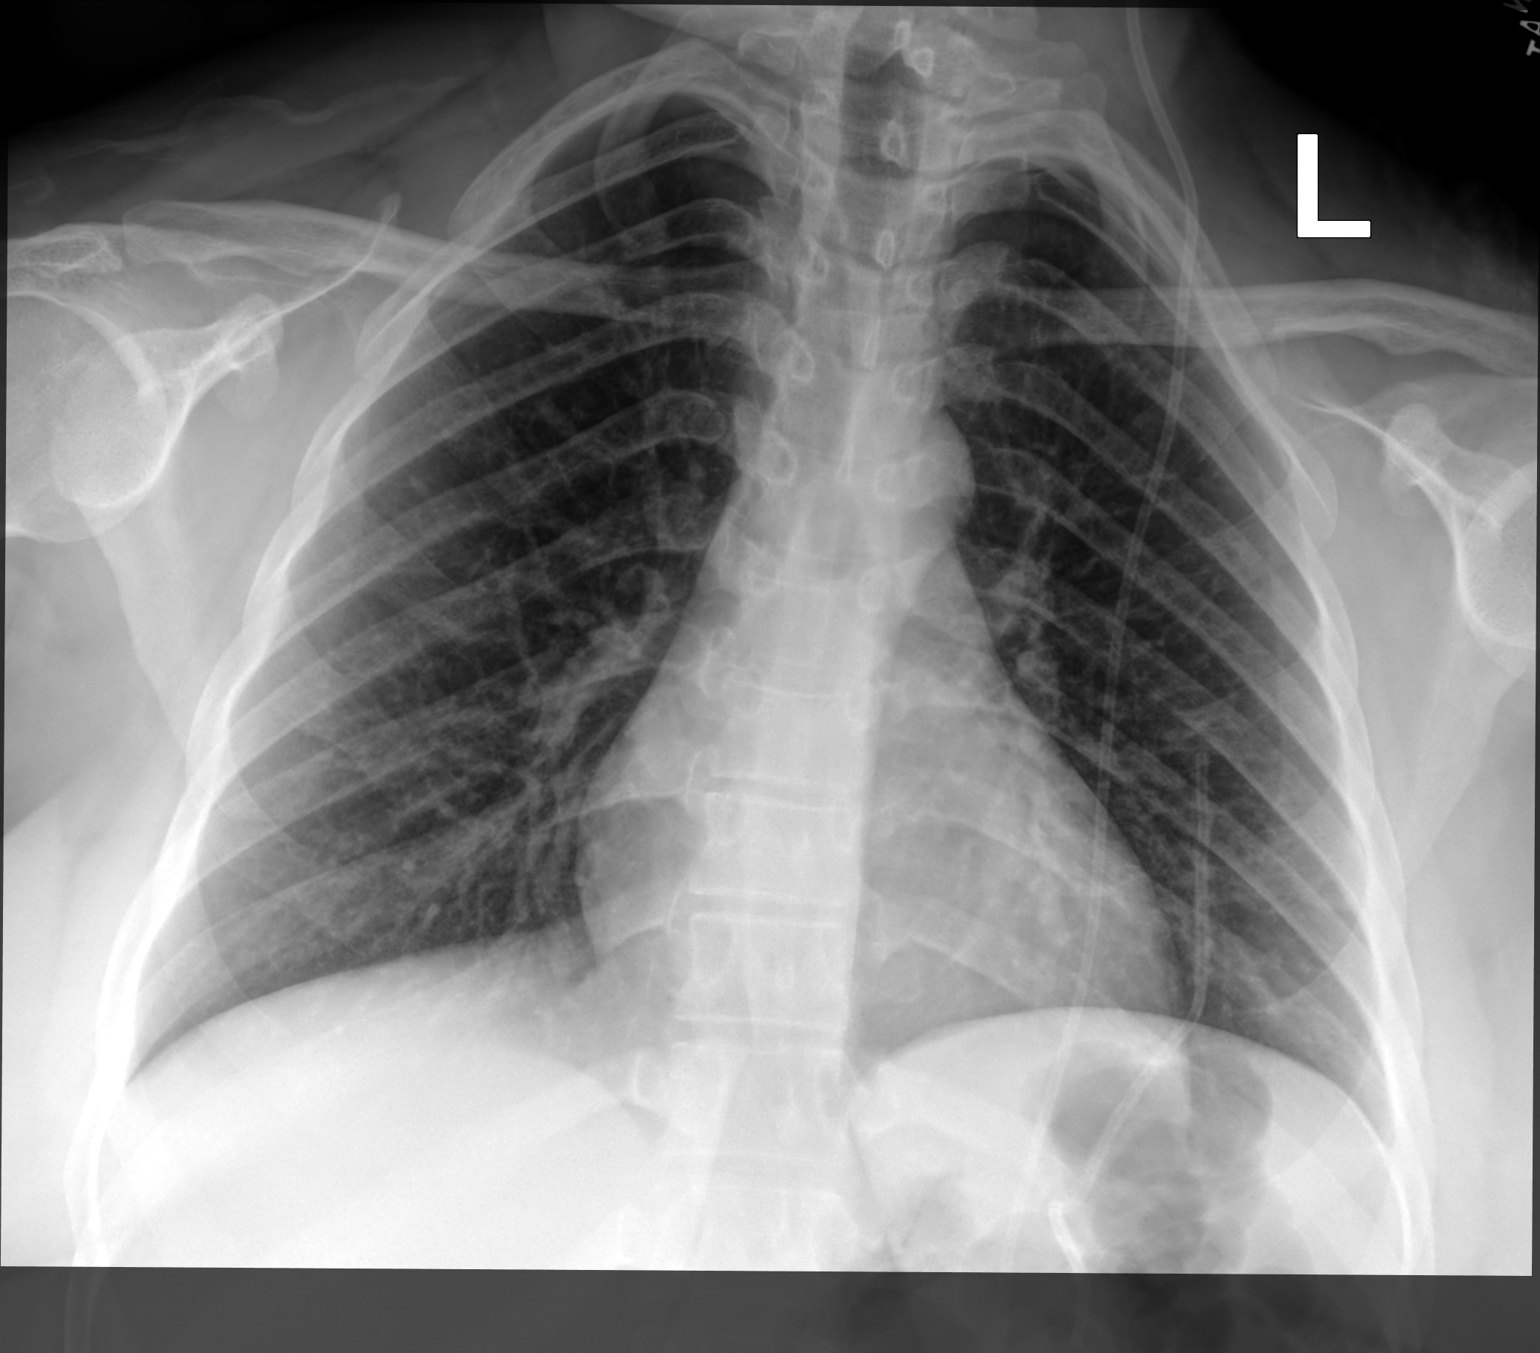

[chest lat]
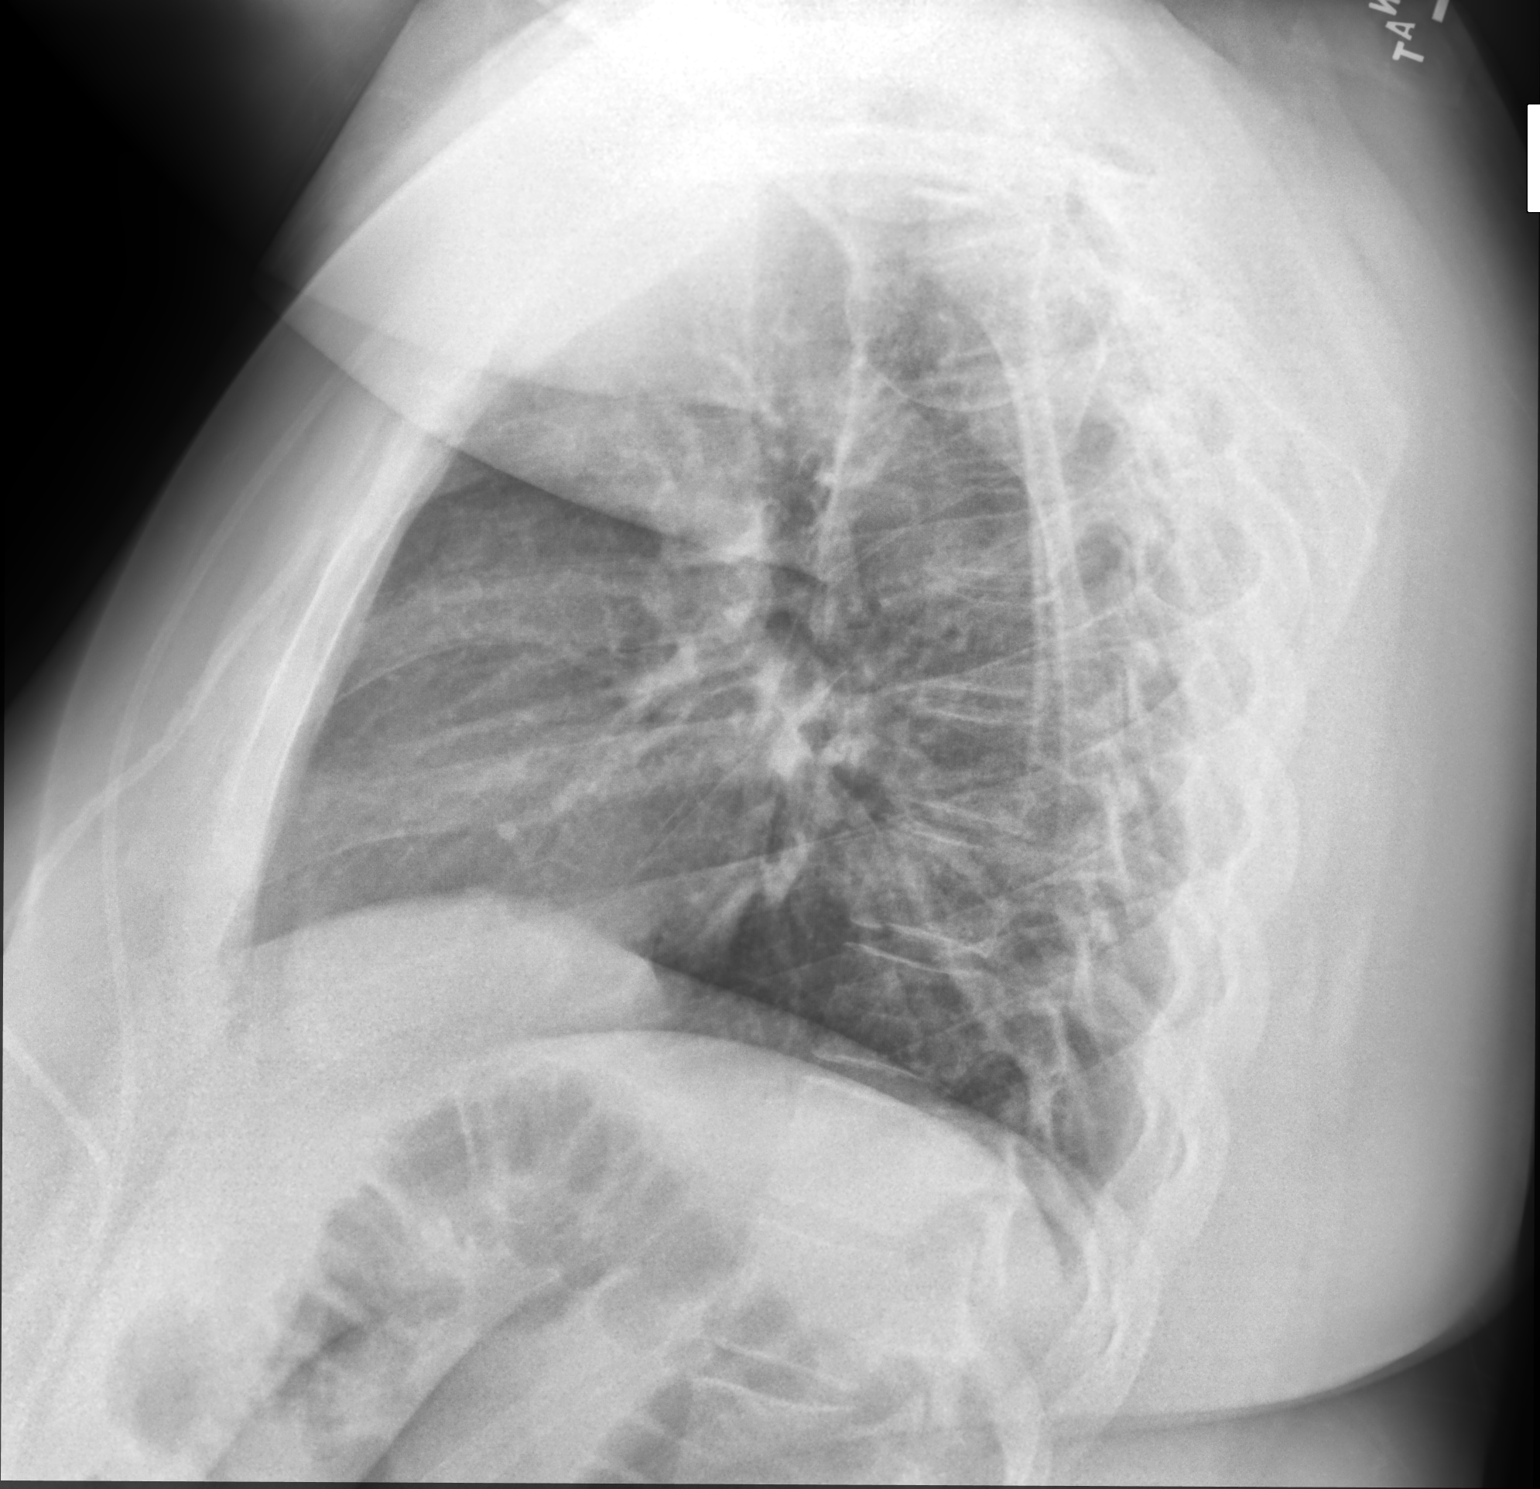

[2 of 2 positions shown; findings below may reference images not displayed]

FINDINGS: Normal cardiac and mediastinal contours. No focal pulmonary opacity.
No pleural effusion or pneumothorax. No acute osseous abnormality.
VP shunt catheter is noted in the soft tissues of the left neck and
hemithorax. Additional abandoned shunt catheter is also noted.
IMPRESSION: No acute cardiopulmonary process.

## 2023-01-02 ENCOUNTER — Encounter (HOSPITAL_COMMUNITY): Payer: Self-pay | Admitting: Emergency Medicine

## 2023-01-02 ENCOUNTER — Emergency Department (HOSPITAL_COMMUNITY)
Admission: EM | Admit: 2023-01-02 | Discharge: 2023-01-02 | Disposition: A | Discharge status: Home / Self Care | Payer: Medicaid Other | Attending: Emergency Medicine | Admitting: Emergency Medicine

## 2023-01-02 ENCOUNTER — Emergency Department (HOSPITAL_COMMUNITY): Payer: Medicaid Other

## 2023-01-02 ENCOUNTER — Other Ambulatory Visit: Payer: Self-pay

## 2023-01-02 DIAGNOSIS — N939 Abnormal uterine and vaginal bleeding, unspecified: Secondary | ICD-10-CM

## 2023-01-02 LAB — CBC WITH DIFFERENTIAL/PLATELET
Abs Immature Granulocytes: 0.01 10*3/uL (ref 0.00–0.07)
Basophils Absolute: 0 10*3/uL (ref 0.0–0.1)
Basophils Relative: 1 %
Eosinophils Absolute: 0 10*3/uL (ref 0.0–0.5)
Eosinophils Relative: 1 %
HCT: 44.5 % (ref 36.0–46.0)
Hemoglobin: 13.9 g/dL (ref 12.0–15.0)
Immature Granulocytes: 0 %
Lymphocytes Relative: 36 %
Lymphs Abs: 2 10*3/uL (ref 0.7–4.0)
MCH: 26.4 pg (ref 26.0–34.0)
MCHC: 31.2 g/dL (ref 30.0–36.0)
MCV: 84.6 fL (ref 80.0–100.0)
Monocytes Absolute: 0.4 10*3/uL (ref 0.1–1.0)
Monocytes Relative: 7 %
Neutro Abs: 3.1 10*3/uL (ref 1.7–7.7)
Neutrophils Relative %: 55 %
Platelets: 296 10*3/uL (ref 150–400)
RBC: 5.26 MIL/uL — ABNORMAL HIGH (ref 3.87–5.11)
RDW: 14.1 % (ref 11.5–15.5)
WBC: 5.6 10*3/uL (ref 4.0–10.5)
nRBC: 0 % (ref 0.0–0.2)

## 2023-01-02 LAB — URINALYSIS, ROUTINE W REFLEX MICROSCOPIC
Bilirubin Urine: NEGATIVE
Glucose, UA: NEGATIVE mg/dL
Ketones, ur: NEGATIVE mg/dL
Nitrite: NEGATIVE
Protein, ur: 30 mg/dL — AB
Specific Gravity, Urine: 1.025 (ref 1.005–1.030)
pH: 7 (ref 5.0–8.0)

## 2023-01-02 LAB — HCG, QUANTITATIVE, PREGNANCY: hCG, Beta Chain, Quant, S: <1 m[IU]/mL (ref <5)

## 2023-01-02 NOTE — Discharge Instructions (Addendum)
Today for abnormal vaginal bleeding.  Your ultrasound was normal and your blood count was also normal.  You are not anemic.  Is important you follow up with gynecology for further evaluation.  Come back to the ER if you have heavy bleeding, dizziness, ear pain, nausea or vomiting or any other worsening symptoms

## 2023-01-02 NOTE — ED Provider Notes (Signed)
[facility] Provider Note   CSN: [id number] Arrival date & time: 01/02/23  4166     History  Chief Complaint  Patient presents with   Vaginal Bleeding    Carol Wheeler is a 27 y.o. female.  His past history of cerebral palsy and history of a VP shunt.  He presents the ER today for evaluation of vaginal bleeding.  He states this been going on for about 2 weeks and described as spotting, not saturating through a pad or tampon.  As she had had her period a week or 2 before that and then the swelling began again.  It is associated with intermittent cramping lasting about 5 minutes several times a day in her lower abdomen, she states primarily the left lower abdomen.  She reports the pain got worse yesterday.  She is G0, P0, not on birth control, history of amenorrhea   Vaginal Bleeding      Home Medications Prior to Admission medications   Medication Sig Start Date End Date Taking? Authorizing Provider  acetaminophen (TYLENOL) 325 MG tablet Take by mouth. 03/23/17   [provider]  naproxen (NAPROSYN) 375 MG tablet Take 1 tablet (375 mg total) by mouth 2 (two) times daily. 02/02/22   Raspet, Derry Skill, PA-C      Allergies    Patient has no known allergies.    Review of Systems   Review of Systems  Genitourinary:  Positive for vaginal bleeding.    Physical Exam Updated Vital Signs BP 134/89 (BP Location: Left Arm)   Pulse (!) 57   Temp 98.8 F (37.1 C) (Oral)   Resp 16   Ht 4\' 11"  (1.499 m)   Wt 94.3 kg   LMP 12/19/2022   SpO2 100%   BMI 42.01 kg/m  Physical Exam Vitals and nursing note reviewed.  Constitutional:      General: She is not in acute distress.    Appearance: She is well-developed.  HENT:     Head: Normocephalic and atraumatic.  Eyes:     Conjunctiva/sclera: Conjunctivae normal.  Cardiovascular:     Rate and Rhythm: Normal rate and regular rhythm.     Heart sounds: No murmur heard. Pulmonary:      Effort: Pulmonary effort is normal. No respiratory distress.     Breath sounds: Normal breath sounds.  Abdominal:     General: There is no distension.     Palpations: Abdomen is soft. There is no mass.     Tenderness: There is no abdominal tenderness.  Musculoskeletal:        General: No swelling.     Cervical back: Neck supple.  Skin:    General: Skin is warm and dry.     Capillary Refill: Capillary refill takes less than 2 seconds.  Neurological:     General: No focal deficit present.     Mental Status: She is alert and oriented to person, place, and time.  Psychiatric:        Mood and Affect: Mood normal.     ED Results / Procedures / Treatments   Labs (all labs ordered are listed, but only abnormal results are displayed) Labs Reviewed  CBC WITH DIFFERENTIAL/PLATELET - Abnormal; Notable for the following components:      Result Value   RBC 5.26 (*)    All other components within normal limits  URINALYSIS, ROUTINE W REFLEX MICROSCOPIC - Abnormal; Notable for the following components:   APPearance CLOUDY (*)  Hgb urine dipstick MODERATE (*)    Protein, ur 30 (*)    Leukocytes,Ua LARGE (*)    Bacteria, UA MANY (*)    All other components within normal limits  HCG, QUANTITATIVE, PREGNANCY    EKG None  Radiology US PELVIC COMPLETE W TRANSVAGINAL AND TORSION R/O  Result Date: 01/02/2023 CLINICAL DATA:  Pelvic pain of unspecified duration. EXAM: TRANSABDOMINAL AND TRANSVAGINAL ULTRASOUND OF PELVIS DOPPLER ULTRASOUND OF OVARIES TECHNIQUE: Both transabdominal and transvaginal ultrasound examinations of the pelvis were performed. Transabdominal technique was performed for global imaging of the pelvis including uterus, ovaries, adnexal regions, and pelvic cul-de-sac. It was necessary to proceed with endovaginal exam following the transabdominal exam to visualize the endometrium and ovaries to better advantage. Color and duplex Doppler ultrasound was utilized to evaluate blood  flow to the ovaries. COMPARISON:  Pelvic CT 06/06/2018 FINDINGS: Uterus Measurements: 7.0 x 3.2 x 3.2 cm = volume: 38.5 mL. No fibroids or other mass visualized. Endometrium Thickness: 8 mm.  No focal abnormality visualized. Right ovary Measurements: 2.1 x 2.5 x 2.2 cm = volume: 6.2 mL. Normal appearance/no adnexal mass. Normal blood flow with color Doppler. Left ovary Measurements: 2.1 x 2.1 x 1.9 cm = volume: 4.4 mL. Normal appearance/no adnexal mass. Normal blood flow with color Doppler. Pulsed Doppler evaluation of both ovaries demonstrates normal low-resistance arterial and venous waveforms. Other findings Small amount of free pelvic fluid, within physiologic limits. IMPRESSION: 1. No acute process.  No evidence of adnexal mass or torsion. 2. Small amount of free pelvic fluid, within physiologic limits. Electronically Signed   By: Richardean Sale M.D.   On: 01/02/2023 11:49    Procedures Procedures    Medications Ordered in ED Medications - No data to display  ED Course/ Medical Decision Making/ A&P                             Medical Decision Making Ddx: Nausea, threatened miscarriage, dysmenorrhea, abnormal uterine bleeding, ovarian cyst, other Lab review-patient is not anemic, no leukocytosis Urinalysis reviewed contaminated sample, patient has no symptoms, no indication for treatment Quantitative hCG is less than 1 Imaging: Pelvic ultrasound with torsion rule out is normal the report  ED course: Has been having several weeks of intermittent vaginal spotting, states is not enough to soak through a pad, she also having intermittent left lower abdominal pain.  Abdominal palpation is reassuring here.  CBC is reassuring.  BMP had not been drawn but is unlikely to change management so was canceled.  Denies any vaginal discharge, vaginal itching, dysuria or frequency.  Ultrasound also normal.  Discussed with patient unsure of the exact cause but she is to follow-up with gynecology.  Strict  return precautions.  She is agreeable plan of care and discharge.   Amount and/or Complexity of Data Reviewed Labs: ordered. Radiology: ordered.           Final Clinical Impression(s) / ED Diagnoses Final diagnoses:  Abnormal uterine bleeding (AUB)    Rx / DC Orders ED Discharge Orders     None         Gwenevere Abbot, PA-C 01/02/23 [place] A, DO 01/02/23 1539

## 2023-01-02 NOTE — ED Triage Notes (Signed)
Patient arrives ambulatory by POV c/o vaginal bleeding/ spotting over the past 2 weeks. C/o left abdominal pain and lower back pain.

## 2023-03-08 ENCOUNTER — Encounter: Payer: 59 | Admitting: Family Medicine

## 2024-02-25 NOTE — Progress Notes (Signed)
 Patient: Carol Wheeler        DOB: 11/22/1995, age 28 y.o. MRN: [id number]  PCP: Penne ONEIDA Pass, PA-C  The patient provided verbal consent for this service, which utilized a telemedicine modality. The patient was located at home, and the provider at [facility] in [place], [place] . Participants in this visit include: Stephania Glatter, Penne Pass, PA-C    VIDEO VISIT Assessment and Plan   1. Vaginal bleeding in pregnancy, first trimester (*) (Primary) 2. Acute midline low back pain without sciatica 3. Less than [redacted] weeks gestation of pregnancy (*) 4. Breast tenderness in female   Assessment & Plan 1. Vaginal bleeding during pregnancy, first trimester. - Started with spotting last night, which has progressed to heavy bleeding, requiring frequent pad changes. - The severity of the bleeding is concerning and could indicate a potential complication. Ddx includes ectopic pregnancy, spontaneous abortion, molar pregnancy, subchorionic hemorrhage, cervical insufficiency, urinary tract infection, and other pelvic infections. - Counseled that it is crucial to receive additional evaluation in person and advised to contact her gynecologist immediately for an earlier appointment.  - If unable to secure an earlier appointment, she should go to the emergency department for further evaluation.  2. Low back pain. - Reports severe lower back pain that started two days ago, initially a 5/10 and escalating to 10/10. - The pain is localized in the middle of her lower back and is constant. - This pain could be related to her pregnancy or potentially a pelvic issue. - Advised to seek immediate medical attention from her gynecologist or the emergency department if the pain persists or worsens.  3. Breast tenderness. - Reports significant breast tenderness and enlargement, which are common early pregnancy symptoms. - Advised to wear supportive clothing to  alleviate discomfort.  4. Pregnancy, first trimester. - Less than [redacted] weeks gestation. - Counseled that breast tenderness, changing bowel habits, and urinary frequency are common early pregnancy symptoms. - These symptoms may not be related to her vaginal bleeding or lower back pain.  5. Change in bowel habits. - Reports more frequent bowel movements, now two to three times a day, compared to once a day previously. - This could be related to her pregnancy. - Advised to monitor her bowel habits and report any significant changes to her healthcare provider.  6. Cerebral Palsy - Increases risk of pregnancy complications.   Follow up if symptoms worsen or fail to improve.    Patient's Medications       * Accurate as of Feb 25, 2024 11:59 PM. Reflects encounter med changes as of last refresh          Continued Medications      Instructions  doxylamine-pyridoxine 10-10 mg per DR tablet Commonly known as: DICLEGIS  2 tablets, Oral, At bedtime   ergocalciferol 50,000 units Caps capsule Commonly known as: Vitamin D2  50,000 Units, Oral, Weekly at 0900   escitalopram oxalate 20 mg tablet Commonly known as: LEXAPRO  20 mg, Daily   medroxyPROGESTERone acetate 10 mg tablet Commonly known as: PROVERA  10 mg, Oral, Daily   pantoprazole  sodium 40 mg tablet Commonly known as: PROTONIX   40 mg, Oral, 2 times a day   PRENATAL VITAMIN PLUS LOW IRON 27-1 MG Tabs  1 tablet, Oral, Daily, Take 1 tablet daily. May take with food if upset stomach.        Patient has been advised as to the limitations and limited nature of physical  exam due to nature of a VIDEO/TELPHONIC visit, the possibility of privacy risk in the use of a video visit, and that the healthcare provider may recommend visiting a healthcare clinic for in-person care and follow up.   Risks, benefits, and alternatives of the medications and treatment plan prescribed today were discussed and patient expressed understanding.  Plan follow-up as discussed or as needed if any worsening symptoms or change in condition. Patient voiced understanding and agreed to attempt to adhere to the treatment plan .      Subjective   This is a 28 y.o. female who presents with:     Patient presents with  . Vaginal Bleeding (Pregnant)  . Back Pain     Chief complaint comments reviewed and discussed with patient in detail.  History of Present Illness The patient is a 28 year old female who presents via virtual visit for evaluation of vaginal bleeding and lower back pain during pregnancy.  She reports experiencing lower back pain, which she rates as 5 out of 10 initially, escalating to 10 out of 10. The pain, localized in the middle of her lower back, has been persistent for the past 2 days. She also mentions a headache but does not report any associated pelvic or abdominal discomfort. The onset of her back pain coincided with the start of her bleeding yesterday. The pain was most severe this morning.  She began experiencing spotting last night, which has since progressed to full bleeding. She quantifies the bleeding as heavy, necessitating the use of a new pad every hour or two. She has an upcoming appointment with her OB/GYN on 03/06/2024.  Additionally, she reports breast tenderness and sensitivity, along with an increase in breast size.  She has noticed an increase in bowel movements, now occurring 2 to 3 times a day compared to once daily previously. She also reports increased urination and a change in urine color. She has no history of diarrhea or associated pain.     Reviewed and updated this visit by provider: Tobacco  Allergies  Meds  Problems  Med Hx  Surg Hx  Fam Hx        Review of Systems:  As per HPI.  All other systems reviewed and are negative.     Objective  This encounter was performed as a video visit utilizing Zoom, or Doximity, if patient unable to use My Chart and Zoom.   Examination  conducted with the use of video cameras/computer monitors.  Vital signs and other aspects of physical exam are limited due to the nature of this encounter type.  There were no vitals filed for this visit.   Physical Exam Vitals reviewed.  Constitutional:      General: She is not in acute distress.    Appearance: Normal appearance.  HENT:     Head: Normocephalic and atraumatic.  Eyes:     General: No scleral icterus.       Right eye: No discharge.        Left eye: No discharge.     Extraocular Movements: Extraocular movements intact.     Conjunctiva/sclera: Conjunctivae normal.  Pulmonary:     Effort: Pulmonary effort is normal. No respiratory distress.  Neurological:     Mental Status: She is alert and oriented to person, place, and time.  Psychiatric:        Mood and Affect: Mood normal.        Behavior: Behavior normal.         Penne Pass,  PA-C     *Some images could not be shown.

## 2024-05-05 ENCOUNTER — Ambulatory Visit (INDEPENDENT_AMBULATORY_CARE_PROVIDER_SITE_OTHER): Admitting: Advanced Practice Midwife

## 2024-05-05 ENCOUNTER — Encounter: Payer: Self-pay | Admitting: Advanced Practice Midwife

## 2024-05-05 VITALS — BP 121/88 | HR 86 | Wt 198.1 lb

## 2024-05-05 DIAGNOSIS — Z3402 Encounter for supervision of normal first pregnancy, second trimester: Secondary | ICD-10-CM | POA: Insufficient documentation

## 2024-05-05 DIAGNOSIS — Z3202 Encounter for pregnancy test, result negative: Secondary | ICD-10-CM

## 2024-05-05 DIAGNOSIS — Z1331 Encounter for screening for depression: Secondary | ICD-10-CM | POA: Diagnosis not present

## 2024-05-05 LAB — POCT URINE PREGNANCY: Preg Test, Ur: NEGATIVE

## 2024-05-05 NOTE — Progress Notes (Signed)
   GYNECOLOGY PROGRESS NOTE  History:  28 y.o. G1P0 presents to [facility] office today for scheduled new OB visit.  visit.  By LMP she would by [redacted]w[redacted]d. She reports positive pregnancy test in May after an episode of bleeding. She had 4 positive home tests.  She was seen for a virtual visit and encouraged to follow up with OB/Gyn or go to the emergency room but bleeding improved so she did not go.    She denies h/a, dizziness, shortness of breath, n/v, or fever/chills.    The following portions of the patient's history were reviewed and updated as appropriate: allergies, current medications, past family history, past medical history, past social history, past surgical history and problem list.   Health Maintenance Due  Topic Date Due   Hepatitis C Screening  Never done   DTaP/Tdap/Td (1 - Tdap) Never done   Hepatitis B Vaccines (1 of 3 - 19+ 3-dose series) Never done   Cervical Cancer Screening (Pap smear)  Never done   HPV VACCINES (1 - 3-dose SCDM series) Never done   COVID-19 Vaccine (1 - 2024-25 season) Never done   Medicare Annual Wellness (AWV)  06/04/2024     Review of Systems:  Pertinent items are noted in HPI.   Objective:  Physical Exam Blood pressure 121/88, pulse 86, weight 198 lb 1.6 oz (89.9 kg), last menstrual period 01/14/2024. VS reviewed, nursing note reviewed,  Constitutional: well developed, well nourished, no distress HEENT: normocephalic CV: normal rate Pulm/chest wall: normal effort Breast Exam: deferred Abdomen: soft Neuro: alert and oriented x 3 Skin: warm, dry Psych: affect normal Pelvic exam: Deferred  Assessment & Plan:    1. Negative pregnancy test --After chart review, with unconfirmed pregnancy, and episodes of bleeding, UPT was done, which was negative. --Beta hCG pending --Discussed results with patient today.  Given negative pregnancy test, unsure if this is early pregnancy loss or if it was false positive pregnancy test in May. --Pt to f/u  for discussion of fertility/pregnancy planning in 1-2 months --Pt states understanding and agrees with plan of care --Repeat hcg as needed  - Beta hCG quant (ref lab)   No follow-ups on file.   Olam Boards, CNM 3:25 PM

## 2024-05-05 NOTE — Progress Notes (Signed)
 Pt presents for new ob/intake. Pt states that her last pap was done at the beginning of the year @ [facility].pt has no questions or concerns at this time.

## 2024-05-06 LAB — BETA HCG QUANT (REF LAB): hCG Quant: <1 m[IU]/mL

## 2024-05-11 ENCOUNTER — Ambulatory Visit: Payer: Self-pay | Admitting: Advanced Practice Midwife

## 2024-05-21 NOTE — Progress Notes (Signed)
 This patient's chart has been reviewed by a [facility] Specialist.   Attempted to contact patient in order to discuss appointments and screenings due for the upcoming year.   Left message with [facility] Specialist's contact information. and [employer] message with health maintenance recommendations and [facility] Specialist's contact information. with recommendations and contact information.  Additional Comments: na

## 2024-06-17 ENCOUNTER — Ambulatory Visit: Admitting: Advanced Practice Midwife

## 2024-07-03 ENCOUNTER — Ambulatory Visit: Admitting: Obstetrics and Gynecology

## 2024-08-18 ENCOUNTER — Ambulatory Visit: Admitting: Advanced Practice Midwife

## 2024-08-20 ENCOUNTER — Other Ambulatory Visit: Payer: Self-pay

## 2024-08-24 ENCOUNTER — Encounter: Payer: Self-pay | Admitting: Obstetrics and Gynecology

## 2024-08-24 ENCOUNTER — Ambulatory Visit (INDEPENDENT_AMBULATORY_CARE_PROVIDER_SITE_OTHER): Admitting: Obstetrics and Gynecology

## 2024-08-24 VITALS — BP 110/57 | HR 84

## 2024-08-24 DIAGNOSIS — N938 Other specified abnormal uterine and vaginal bleeding: Secondary | ICD-10-CM | POA: Diagnosis not present

## 2024-08-24 DIAGNOSIS — Z3202 Encounter for pregnancy test, result negative: Secondary | ICD-10-CM | POA: Diagnosis not present

## 2024-08-24 LAB — POCT URINE PREGNANCY: Preg Test, Ur: NEGATIVE

## 2024-08-24 MED ORDER — MEDROXYPROGESTERONE ACETATE 10 MG PO TABS: 10.0000 mg | ORAL_TABLET | Freq: Every day | ORAL | 0 refills | Status: AC

## 2024-08-24 NOTE — Progress Notes (Signed)
 28 y.o. GYN presents for AUB. C/o bleeding since 07/18/24 to present, fatigue, hunger pangs, feeling cold.  Denies dizziness, NV.   Last PAP 05/11/2024

## 2024-08-24 NOTE — Progress Notes (Signed)
  CC: irregular bleeding Subjective:    Patient ID: Carol Wheeler, female    DOB: Sep 26, 1996, 28 y.o.   MRN: [id number]  HPI 28 yo G1P0 seen for discussion of irregular bleeding.  Pt had menses 10/4-10/11.  Bleeding stopped for a day and restarted the following today until the present.  Pt noted intermittent spotting.   Review of Systems     Objective:   Physical Exam Vitals:   08/24/24 1440  BP: (!) 110/57  Pulse: 84         Assessment & Plan:   1. DUB (dysfunctional uterine bleeding) (Primary) Will check hormone panel and pelvic ultrasound. Provera to stop bleeding.  Pt advised she will likely have a withdrawal bleed afterwards  Will follow up in 2 months to see if bleeding has regulated.  She may still need OCP of patch for long term regulation.  Pt would like to preserve fertility.  Progesterone IUD may also be a treatment option.  - POCT urine pregnancy - Thyroid Panel With TSH - Prolactin - Follicle stimulating hormone - US  PELVIC COMPLETE WITH TRANSVAGINAL; Future - medroxyPROGESTERone (PROVERA) 10 MG tablet; Take 1 tablet (10 mg total) by mouth daily.  Dispense: 7 tablet; Refill: 0    Jerilynn DELENA Buddle, MD Faculty Attending, Center for [facility]

## 2024-08-25 LAB — FOLLICLE STIMULATING HORMONE: FSH: 7.7 m[IU]/mL

## 2024-08-25 LAB — THYROID PANEL WITH TSH
Free Thyroxine Index: 2 (ref 1.2–4.9)
T3 Uptake Ratio: 25 % (ref 24–39)
T4, Total: 8.1 ug/dL (ref 4.5–12.0)
TSH: 0.916 u[IU]/mL (ref 0.450–4.500)

## 2024-08-25 LAB — PROLACTIN: Prolactin: 9.3 ng/mL (ref 4.8–33.4)

## 2024-08-26 ENCOUNTER — Ambulatory Visit: Payer: Self-pay | Admitting: Obstetrics and Gynecology

## 2024-08-31 ENCOUNTER — Ambulatory Visit (HOSPITAL_COMMUNITY)

## 2024-09-14 ENCOUNTER — Ambulatory Visit (HOSPITAL_COMMUNITY): Admission: RE | Admit: 2024-09-14 | Source: Ambulatory Visit

## 2024-10-14 ENCOUNTER — Ambulatory Visit: Admitting: Obstetrics and Gynecology

## 2024-10-19 ENCOUNTER — Ambulatory Visit (HOSPITAL_COMMUNITY)
Admission: RE | Admit: 2024-10-19 | Discharge: 2024-10-19 | Disposition: A | Discharge status: Home / Self Care | Source: Ambulatory Visit | Attending: Obstetrics and Gynecology | Admitting: Obstetrics and Gynecology

## 2024-10-19 DIAGNOSIS — N938 Other specified abnormal uterine and vaginal bleeding: Secondary | ICD-10-CM | POA: Insufficient documentation

## 2024-11-02 ENCOUNTER — Ambulatory Visit: Admitting: Obstetrics and Gynecology

## 2024-11-04 ENCOUNTER — Ambulatory Visit: Admitting: Obstetrics and Gynecology

## 2024-11-08 NOTE — Progress Notes (Signed)
Pt was not seen on this date.   

## 2024-11-16 ENCOUNTER — Encounter: Payer: Self-pay | Admitting: Obstetrics

## 2024-11-16 ENCOUNTER — Telehealth: Admitting: Obstetrics

## 2024-11-18 ENCOUNTER — Ambulatory Visit: Admitting: Obstetrics

## 2024-11-18 ENCOUNTER — Encounter: Payer: Self-pay | Admitting: Obstetrics

## 2024-11-18 VITALS — BP 127/89 | HR 89 | Ht 59.0 in | Wt 210.2 lb

## 2024-11-18 DIAGNOSIS — Z6841 Body Mass Index (BMI) 40.0 and over, adult: Secondary | ICD-10-CM

## 2024-11-18 DIAGNOSIS — N912 Amenorrhea, unspecified: Secondary | ICD-10-CM

## 2024-11-18 LAB — POCT URINE PREGNANCY: Preg Test, Ur: NEGATIVE

## 2024-11-18 NOTE — Progress Notes (Signed)
 Patient ID: Carol Wheeler, female   DOB: [birth year], 29 y.o.   MRN: [id number]  Chief Complaint  Patient presents with   Follow-up    HPI Carol Wheeler is a 29 y.o. female.  Presents for c/o no period since Thanksgiving.  Denies pelvic pain. HPI  Past Medical History:  Diagnosis Date   Cerebral palsy (HCC)    Developmental delay     Past Surgical History:  Procedure Laterality Date   LAPAROSCOPIC REVISION VENTRICULAR-PERITONEAL (V-P) SHUNT      Family History  Problem Relation Age of Onset   Healthy Mother    Healthy Father     Social History Social History[1]  Allergies[2]  Current Outpatient Medications  Medication Sig Dispense Refill   medroxyPROGESTERone  (PROVERA ) 10 MG tablet Take 1 tablet (10 mg total) by mouth daily. (Patient not taking: Reported on 11/18/2024) 7 tablet 0   No current facility-administered medications for this visit.    Review of Systems Review of Systems Constitutional: negative for fatigue and weight loss Respiratory: negative for cough and wheezing Cardiovascular: negative for chest pain, fatigue and palpitations Gastrointestinal: negative for abdominal pain and change in bowel habits Genitourinary: positive for amenorrhea Integument/breast: negative for nipple discharge Musculoskeletal:negative for myalgias Neurological: negative for gait problems and tremors Behavioral/Psych: negative for abusive relationship, depression Endocrine: negative for temperature intolerance      Blood pressure 127/89, pulse 89, height 4' 11 (1.499 m), weight 210 lb 3.2 oz (95.3 kg), last menstrual period 09/10/2024, unknown if currently breastfeeding.  Physical Exam Physical Exam General:   Alert and no distress  Skin:   no rash or abnormalities  Lungs:   clear to auscultation bilaterally  Heart:   regular rate and rhythm, S1, S2 normal, no murmur, click, rub or gallop  Breasts:   normal without suspicious masses, skin or nipple changes or axillary  nodes  Abdomen:  normal findings: no organomegaly, soft, non-tender and no hernia  Pelvis:  External genitalia: normal general appearance Urinary system: urethral meatus normal and bladder without fullness, nontender Vaginal: normal without tenderness, induration or masses Cervix: normal appearance Adnexa: normal bimanual exam Uterus: anteverted and non-tender, normal size    I have spent a total of 20 minutes of face-to-face and non-face-to-face time, excluding clinical staff time, reviewing notes and preparing to see patient, ordering tests and/or medications, and counseling the patient.   Data Reviewed Upt ULTRASOUND  Assessment     1. Amenorrhea (Primary) Rx: - POCT urine pregnancy:  Negative - hCG, serum, qualitative - TSH - Prolactin  2. Class 3 severe obesity due to excess calories without serious comorbidity with body mass index (BMI) of 45.0 to 49.9 in adult (HCC) - weight reduction with the aid of dietary changes, exercise and behavioral modification recommended    Plan   Follow up in 6 weeks  Orders Placed This Encounter  Procedures   hCG, serum, qualitative   TSH   Prolactin   POCT urine pregnancy    CARLIN RONAL CENTERS, MD, FACOG Attending Obstetrician & Gynecologist, [facility] for [facility], [facility], [facility] 11/18/2024    [1]  Social History Tobacco Use   Smoking status: Never   Smokeless tobacco: Never  Substance Use Topics   Alcohol use: No   Drug use: No  [2] No Known Allergies

## 2024-11-18 NOTE — Progress Notes (Signed)
 Pt presents for u/s f/u

## 2024-11-19 LAB — TSH: TSH: 1.55 u[IU]/mL (ref 0.450–4.500)

## 2024-11-19 LAB — HCG, SERUM, QUALITATIVE: hCG,Beta Subunit,Qual,Serum: NEGATIVE m[IU]/mL

## 2024-11-19 LAB — PROLACTIN: Prolactin: 23.4 ng/mL (ref 4.8–33.4)

## 2025-01-04 ENCOUNTER — Telehealth: Payer: Self-pay | Admitting: Obstetrics
